# Patient Record
Sex: Female | Born: 1985 | Race: Black or African American | Hispanic: No | Smoking: Never smoker
Health system: Southern US, Community
[De-identification: ages and names within clinical notes are randomized; demographics above are authoritative.]

## PROBLEM LIST (undated history)

## (undated) ENCOUNTER — Emergency Department (HOSPITAL_COMMUNITY): Admission: EM | Payer: 59 | Source: Home / Self Care

## (undated) DIAGNOSIS — I10 Essential (primary) hypertension: Secondary | ICD-10-CM

## (undated) DIAGNOSIS — B9689 Other specified bacterial agents as the cause of diseases classified elsewhere: Secondary | ICD-10-CM

## (undated) DIAGNOSIS — N760 Acute vaginitis: Secondary | ICD-10-CM

## (undated) HISTORY — PX: NO PAST SURGERIES: SHX2092

---
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
## 2004-04-13 ENCOUNTER — Emergency Department (HOSPITAL_COMMUNITY): Admission: EM | Admit: 2004-04-13 | Discharge: 2004-04-13 | Payer: Self-pay | Admitting: Family Medicine

## 2005-06-14 ENCOUNTER — Emergency Department (HOSPITAL_COMMUNITY): Admission: EM | Admit: 2005-06-14 | Discharge: 2005-06-14 | Payer: Self-pay | Admitting: Family Medicine

## 2005-07-17 ENCOUNTER — Emergency Department (HOSPITAL_COMMUNITY): Admission: EM | Admit: 2005-07-17 | Discharge: 2005-07-17 | Payer: Self-pay | Admitting: Family Medicine

## 2005-07-30 ENCOUNTER — Emergency Department (HOSPITAL_COMMUNITY): Admission: EM | Admit: 2005-07-30 | Discharge: 2005-07-30 | Payer: Self-pay | Admitting: Emergency Medicine

## 2005-08-06 ENCOUNTER — Emergency Department (HOSPITAL_COMMUNITY): Admission: EM | Admit: 2005-08-06 | Discharge: 2005-08-06 | Payer: Self-pay | Admitting: Emergency Medicine

## 2005-10-11 ENCOUNTER — Observation Stay (HOSPITAL_COMMUNITY): Admission: AD | Admit: 2005-10-11 | Discharge: 2005-10-12 | Payer: Self-pay | Admitting: *Deleted

## 2005-10-12 ENCOUNTER — Ambulatory Visit: Payer: Self-pay | Admitting: Family Medicine

## 2005-11-16 ENCOUNTER — Emergency Department (HOSPITAL_COMMUNITY): Admission: EM | Admit: 2005-11-16 | Discharge: 2005-11-16 | Payer: Self-pay | Admitting: Family Medicine

## 2005-12-02 ENCOUNTER — Emergency Department (HOSPITAL_COMMUNITY): Admission: EM | Admit: 2005-12-02 | Discharge: 2005-12-02 | Payer: Self-pay | Admitting: Family Medicine

## 2005-12-03 ENCOUNTER — Emergency Department (HOSPITAL_COMMUNITY): Admission: EM | Admit: 2005-12-03 | Discharge: 2005-12-03 | Payer: Self-pay | Admitting: Emergency Medicine

## 2006-01-05 ENCOUNTER — Ambulatory Visit: Payer: Self-pay | Admitting: Internal Medicine

## 2006-02-21 ENCOUNTER — Ambulatory Visit: Payer: Self-pay | Admitting: Certified Nurse Midwife

## 2006-02-21 ENCOUNTER — Inpatient Hospital Stay (HOSPITAL_COMMUNITY): Admission: AD | Admit: 2006-02-21 | Discharge: 2006-02-24 | Payer: Self-pay | Admitting: Gynecology

## 2006-03-07 ENCOUNTER — Emergency Department (HOSPITAL_COMMUNITY): Admission: EM | Admit: 2006-03-07 | Discharge: 2006-03-07 | Payer: Self-pay | Admitting: Emergency Medicine

## 2007-06-11 ENCOUNTER — Emergency Department (HOSPITAL_COMMUNITY): Admission: EM | Admit: 2007-06-11 | Discharge: 2007-06-11 | Payer: Self-pay | Admitting: Family Medicine

## 2008-11-22 ENCOUNTER — Inpatient Hospital Stay (HOSPITAL_COMMUNITY): Admission: AD | Admit: 2008-11-22 | Discharge: 2008-11-22 | Payer: Self-pay | Admitting: Family Medicine

## 2009-02-06 ENCOUNTER — Ambulatory Visit: Payer: Self-pay | Admitting: Advanced Practice Midwife

## 2009-02-06 ENCOUNTER — Inpatient Hospital Stay (HOSPITAL_COMMUNITY): Admission: AD | Admit: 2009-02-06 | Discharge: 2009-02-06 | Payer: Self-pay | Admitting: Obstetrics & Gynecology

## 2009-02-15 ENCOUNTER — Emergency Department (HOSPITAL_COMMUNITY): Admission: EM | Admit: 2009-02-15 | Discharge: 2009-02-15 | Payer: Self-pay | Admitting: Family Medicine

## 2009-05-27 ENCOUNTER — Inpatient Hospital Stay (HOSPITAL_COMMUNITY): Admission: AD | Admit: 2009-05-27 | Discharge: 2009-05-29 | Payer: Self-pay | Admitting: Obstetrics and Gynecology

## 2009-10-04 ENCOUNTER — Emergency Department (HOSPITAL_COMMUNITY): Admission: EM | Admit: 2009-10-04 | Discharge: 2009-10-04 | Payer: Self-pay | Admitting: Family Medicine

## 2010-01-07 ENCOUNTER — Emergency Department (HOSPITAL_COMMUNITY): Admission: EM | Admit: 2010-01-07 | Discharge: 2010-01-07 | Payer: Self-pay | Admitting: Family Medicine

## 2010-03-16 ENCOUNTER — Emergency Department (HOSPITAL_COMMUNITY): Admission: EM | Admit: 2010-03-16 | Discharge: 2010-03-16 | Payer: Self-pay | Admitting: Emergency Medicine

## 2010-05-24 ENCOUNTER — Emergency Department (HOSPITAL_COMMUNITY): Admission: EM | Admit: 2010-05-24 | Discharge: 2010-05-24 | Payer: Self-pay | Admitting: Emergency Medicine

## 2010-10-21 LAB — URINALYSIS, ROUTINE W REFLEX MICROSCOPIC
Bilirubin Urine: NEGATIVE
Glucose, UA: NEGATIVE mg/dL
Ketones, ur: 15 mg/dL — AB
Leukocytes, UA: NEGATIVE
Nitrite: NEGATIVE
Protein, ur: 30 mg/dL — AB
Specific Gravity, Urine: 1.022 (ref 1.005–1.030)
Urobilinogen, UA: 0.2 mg/dL (ref 0.0–1.0)
pH: 5.5 (ref 5.0–8.0)

## 2010-10-21 LAB — RAPID URINE DRUG SCREEN, HOSP PERFORMED
Cocaine: NOT DETECTED
Opiates: NOT DETECTED
Tetrahydrocannabinol: POSITIVE — AB

## 2010-10-21 LAB — URINE MICROSCOPIC-ADD ON

## 2010-10-21 LAB — POCT I-STAT, CHEM 8
BUN: 11 mg/dL (ref 6–23)
Calcium, Ion: 1.13 mmol/L (ref 1.12–1.32)
Chloride: 107 mEq/L (ref 96–112)
Creatinine, Ser: 0.8 mg/dL (ref 0.4–1.2)
Glucose, Bld: 95 mg/dL (ref 70–99)

## 2010-10-27 ENCOUNTER — Inpatient Hospital Stay (INDEPENDENT_AMBULATORY_CARE_PROVIDER_SITE_OTHER)
Admission: RE | Admit: 2010-10-27 | Discharge: 2010-10-27 | Disposition: A | Discharge status: Home / Self Care | Payer: Self-pay | Source: Ambulatory Visit | Attending: Family Medicine | Admitting: Family Medicine

## 2010-10-27 DIAGNOSIS — L989 Disorder of the skin and subcutaneous tissue, unspecified: Secondary | ICD-10-CM

## 2010-10-27 DIAGNOSIS — R350 Frequency of micturition: Secondary | ICD-10-CM

## 2010-10-27 LAB — POCT URINALYSIS DIP (DEVICE)
Bilirubin Urine: NEGATIVE
Ketones, ur: NEGATIVE mg/dL
Specific Gravity, Urine: 1.025 (ref 1.005–1.030)
pH: 6 (ref 5.0–8.0)

## 2010-10-28 LAB — URINE CULTURE
Culture  Setup Time: 201203201325
Culture: NO GROWTH

## 2010-11-12 LAB — CBC
MCHC: 32.9 g/dL (ref 30.0–36.0)
MCHC: 33.6 g/dL (ref 30.0–36.0)
Platelets: 100 10*3/uL — ABNORMAL LOW (ref 150–400)
Platelets: 115 10*3/uL — ABNORMAL LOW (ref 150–400)
RBC: 3.59 MIL/uL — ABNORMAL LOW (ref 3.87–5.11)
RDW: 14.2 % (ref 11.5–15.5)
WBC: 8 10*3/uL (ref 4.0–10.5)

## 2010-11-15 LAB — URINALYSIS, ROUTINE W REFLEX MICROSCOPIC
Glucose, UA: NEGATIVE mg/dL
Leukocytes, UA: NEGATIVE
Nitrite: NEGATIVE
Protein, ur: NEGATIVE mg/dL
pH: 5.5 (ref 5.0–8.0)

## 2010-11-15 LAB — URINE MICROSCOPIC-ADD ON

## 2010-11-18 LAB — URINALYSIS, ROUTINE W REFLEX MICROSCOPIC
Bilirubin Urine: NEGATIVE
Hgb urine dipstick: NEGATIVE
Ketones, ur: NEGATIVE mg/dL
Nitrite: NEGATIVE
Specific Gravity, Urine: 1.02 (ref 1.005–1.030)
pH: 7.5 (ref 5.0–8.0)

## 2010-11-18 LAB — CBC
HCT: 36.7 % (ref 36.0–46.0)
Hemoglobin: 12.4 g/dL (ref 12.0–15.0)
MCHC: 33.8 g/dL (ref 30.0–36.0)
MCV: 89.7 fL (ref 78.0–100.0)
RBC: 4.09 MIL/uL (ref 3.87–5.11)
RDW: 13.9 % (ref 11.5–15.5)

## 2010-11-18 LAB — GC/CHLAMYDIA PROBE AMP, GENITAL
Chlamydia, DNA Probe: NEGATIVE
GC Probe Amp, Genital: NEGATIVE

## 2010-11-18 LAB — WET PREP, GENITAL: Yeast Wet Prep HPF POC: NONE SEEN

## 2010-12-25 NOTE — Discharge Summary (Signed)
Desiree Stewart, Desiree Stewart               ACCOUNT NO.:  [id number]   MEDICAL RECORD NO.:  [id number]          PATIENT TYPE:  INP   LOCATION:  9315                          FACILITY:  [facility]   PHYSICIAN:  Tracy L. Mayford Knife, M.D.DATE OF BIRTH:  1986-01-24   DATE OF ADMISSION:  10/11/2005  DATE OF DISCHARGE:  10/12/2005                                 DISCHARGE SUMMARY   ADMIT DIAGNOSES:  1.  Gastroenteritis.  2.  Dehydration.  3.  Second trimester pregnancy.  4.  No prenatal care.   DISCHARGE DIAGNOSES:  1.  Gastroenteritis.  2.  Dehydration.  3.  Second trimester pregnancy.  4.  No prenatal care.   LABORATORY:  Initial CBC showed a WBC of 7.9.  Repeat the following day  showed a WBC of 4.6.  Hemoglobin initially was 13.3.  After hydration it was  10.8.  Blood type A positive, antibody negative and RPR nonreactive.   IMAGING:  Ultrasound showed a single female infant in the transverse  presentation with an anterior placenta, no grade 1 and no previa.  AFI was  6.7 cm pocket, which is normal.  Fetal biometry consistent with a 19 week 1  day gestation, which gives an [facility] of March 07, 2006.  Cervix 3.8 cm.   HOSPITAL COURSE:  The patient is a 25 year old G1 at 91 weeks 3 days dated  by last menstrual period of May 31, 2005 consistent with a second  trimester ultrasound, who presented with nausea, vomiting and diarrhea.  She  complained of feeling weak and dizzy, and she had a friend, who was recently  admitted for the same.  Her initial vital signs showed that she was  tachycardic.  Her UA was consistent with her being dehydrated with a  specific gravity of 1.030 and consistent with her not eating as shown by the  ketones of greater than 80.  She was admitted for gastroenteritis and  dehydration.  The patient responded well to IV fluids, Zofran and Phenergan,  and on hospital day 2, she was stable for discharge.   DISPOSITION:  Home.   FOLLOW UP:  [facility]  on Thursday as previously  scheduled.   DISCHARGE INSTRUCTIONS:  The patient count told to do the BRAT diet and to  increase her fluid intake.  She is to return if she has more trouble.   ACTIVITY:  As tolerated.   MEDICATIONS:  1.  Prenatal vitamins.  2.  Phenergan 25 mg one p.o. q.6h. p.r.n. nausea.           ______________________________  Marc Morgans. Mayford Knife, M.D.     TLW/MEDQ  D:  10/12/2005  T:  10/12/2005  Job:  [id number]

## 2011-01-16 ENCOUNTER — Inpatient Hospital Stay (INDEPENDENT_AMBULATORY_CARE_PROVIDER_SITE_OTHER)
Admission: RE | Admit: 2011-01-16 | Discharge: 2011-01-16 | Disposition: A | Discharge status: Home / Self Care | Payer: Self-pay | Source: Ambulatory Visit | Attending: Emergency Medicine | Admitting: Emergency Medicine

## 2011-01-16 DIAGNOSIS — N898 Other specified noninflammatory disorders of vagina: Secondary | ICD-10-CM

## 2011-01-16 DIAGNOSIS — K089 Disorder of teeth and supporting structures, unspecified: Secondary | ICD-10-CM

## 2011-01-16 DIAGNOSIS — N760 Acute vaginitis: Secondary | ICD-10-CM

## 2011-01-16 LAB — POCT URINALYSIS DIP (DEVICE)
Glucose, UA: 100 mg/dL — AB
Ketones, ur: NEGATIVE mg/dL
Leukocytes, UA: NEGATIVE
Nitrite: NEGATIVE
pH: 5.5 (ref 5.0–8.0)

## 2011-01-16 LAB — WET PREP, GENITAL
Trich, Wet Prep: NONE SEEN
Yeast Wet Prep HPF POC: NONE SEEN

## 2011-01-16 LAB — POCT PREGNANCY, URINE: Preg Test, Ur: NEGATIVE

## 2011-01-19 LAB — GC/CHLAMYDIA PROBE AMP, GENITAL: GC Probe Amp, Genital: NEGATIVE

## 2011-03-23 ENCOUNTER — Inpatient Hospital Stay (INDEPENDENT_AMBULATORY_CARE_PROVIDER_SITE_OTHER)
Admission: RE | Admit: 2011-03-23 | Discharge: 2011-03-23 | Disposition: A | Discharge status: Home / Self Care | Payer: Self-pay | Source: Ambulatory Visit | Attending: Family Medicine | Admitting: Family Medicine

## 2011-03-23 DIAGNOSIS — N946 Dysmenorrhea, unspecified: Secondary | ICD-10-CM

## 2011-03-23 LAB — POCT URINALYSIS DIP (DEVICE)
Bilirubin Urine: NEGATIVE
Ketones, ur: NEGATIVE mg/dL
Leukocytes, UA: NEGATIVE
Protein, ur: NEGATIVE mg/dL
Specific Gravity, Urine: 1.02 (ref 1.005–1.030)
pH: 7.5 (ref 5.0–8.0)

## 2011-03-24 LAB — GC/CHLAMYDIA PROBE AMP, GENITAL: Chlamydia, DNA Probe: NEGATIVE

## 2011-05-18 LAB — I-STAT 8, (EC8 V) (CONVERTED LAB)
Chloride: 109
HCT: 46
Hemoglobin: 15.6 — ABNORMAL HIGH
Operator id: 235561
Potassium: 4.1

## 2011-07-30 ENCOUNTER — Encounter: Payer: Self-pay | Admitting: *Deleted

## 2011-07-30 ENCOUNTER — Emergency Department (INDEPENDENT_AMBULATORY_CARE_PROVIDER_SITE_OTHER)
Admission: EM | Admit: 2011-07-30 | Discharge: 2011-07-30 | Disposition: A | Discharge status: Home / Self Care | Payer: Self-pay | Source: Home / Self Care | Attending: Family Medicine | Admitting: Family Medicine

## 2011-07-30 DIAGNOSIS — N760 Acute vaginitis: Secondary | ICD-10-CM

## 2011-07-30 HISTORY — DX: Acute vaginitis: N76.0

## 2011-07-30 HISTORY — DX: Other specified bacterial agents as the cause of diseases classified elsewhere: B96.89

## 2011-07-30 LAB — POCT URINALYSIS DIP (DEVICE)
Bilirubin Urine: NEGATIVE
Glucose, UA: NEGATIVE mg/dL
Leukocytes, UA: NEGATIVE
Nitrite: NEGATIVE
Urobilinogen, UA: 0.2 mg/dL (ref 0.0–1.0)
pH: 5.5 (ref 5.0–8.0)

## 2011-07-30 MED ORDER — CLINDAMYCIN HCL 150 MG PO CAPS: 150.0000 mg | ORAL_CAPSULE | Freq: Four times a day (QID) | ORAL | Status: AC

## 2011-07-30 NOTE — ED Notes (Signed)
Pt states she was seen a few weeks ago for BV and was given Flagyl.  STates she took 1 pill, vomited and did not take any more.  States she thought it would go away on it's own.  Still having a discharge and a fishy odor.  On period now.

## 2011-07-30 NOTE — ED Provider Notes (Signed)
History     CSN: [id number]  Arrival date & time 07/30/11  9147   First MD Initiated Contact with Patient 07/30/11 1040      Chief Complaint  Patient presents with  . Vaginal Discharge    (Consider location/radiation/quality/duration/timing/severity/associated sxs/prior treatment) HPI Comments: The patient was seem here a few wks ago and dxed with bacterial vaginosis. Was given flagyl. Took one pill and it made her nauseated. She did not take anymore and did not contact us. States she hoped it would go away. No new symptoms. Currently on her period. No uti symptoms  The history is provided by the patient.    Past Medical History  Diagnosis Date  . Bacterial vaginosis     History reviewed. No pertinent past surgical history.  History reviewed. No pertinent family history.  History  Substance Use Topics  . Smoking status: Never Smoker   . Smokeless tobacco: Not on file  . Alcohol Use: Yes     socially    OB History    Grav Para Term Preterm Abortions TAB SAB Ect Mult Living                  Review of Systems  Constitutional: Negative.   HENT: Negative.   Cardiovascular: Negative.   Gastrointestinal: Negative.   Musculoskeletal: Negative.   Skin: Negative.   do not see need for repeat pelvic. Will treat with clinda Allergies  Review of patient's allergies indicates no known allergies.  Home Medications   Current Outpatient Rx  Name Route Sig Dispense Refill  . CLINDAMYCIN HCL 150 MG PO CAPS Oral Take 1 capsule (150 mg total) by mouth 4 (four) times daily. 28 capsule 0    BP 136/81  Pulse 77  Temp(Src) 98.2 F (36.8 C) (Oral)  Resp 16  SpO2 100%  LMP 07/26/2011  Physical Exam  Vitals reviewed. Cardiovascular: Normal rate.   Pulmonary/Chest: Effort normal.  Abdominal: Soft. There is no tenderness.    ED Course  Procedures (including critical care time)  Labs Reviewed  POCT URINALYSIS DIP (DEVICE) - Abnormal; Notable for the following:    Ketones, ur TRACE (*)    Hgb urine dipstick SMALL (*)    All other components within normal limits  POCT PREGNANCY, URINE  POCT URINALYSIS DIPSTICK  POCT PREGNANCY, URINE   No results found.   1. Vaginitis       MDM          Randa Spike, MD 07/30/11 1114

## 2012-10-22 ENCOUNTER — Emergency Department (HOSPITAL_COMMUNITY)
Admission: EM | Admit: 2012-10-22 | Discharge: 2012-10-22 | Disposition: A | Discharge status: Home / Self Care | Payer: BC Managed Care – PPO | Attending: Emergency Medicine | Admitting: Emergency Medicine

## 2012-10-22 ENCOUNTER — Emergency Department (HOSPITAL_COMMUNITY): Payer: BC Managed Care – PPO

## 2012-10-22 ENCOUNTER — Emergency Department (INDEPENDENT_AMBULATORY_CARE_PROVIDER_SITE_OTHER)
Admission: EM | Admit: 2012-10-22 | Discharge: 2012-10-22 | Disposition: A | Discharge status: Nursing Home (Includes ICF) | Payer: BC Managed Care – PPO | Source: Home / Self Care | Attending: Emergency Medicine | Admitting: Emergency Medicine

## 2012-10-22 ENCOUNTER — Encounter (HOSPITAL_COMMUNITY): Payer: Self-pay | Admitting: Physical Medicine and Rehabilitation

## 2012-10-22 ENCOUNTER — Encounter (HOSPITAL_COMMUNITY): Payer: Self-pay | Admitting: *Deleted

## 2012-10-22 DIAGNOSIS — K529 Noninfective gastroenteritis and colitis, unspecified: Secondary | ICD-10-CM

## 2012-10-22 DIAGNOSIS — R1011 Right upper quadrant pain: Secondary | ICD-10-CM | POA: Insufficient documentation

## 2012-10-22 DIAGNOSIS — G8929 Other chronic pain: Secondary | ICD-10-CM

## 2012-10-22 DIAGNOSIS — R112 Nausea with vomiting, unspecified: Secondary | ICD-10-CM | POA: Insufficient documentation

## 2012-10-22 DIAGNOSIS — Z8742 Personal history of other diseases of the female genital tract: Secondary | ICD-10-CM | POA: Insufficient documentation

## 2012-10-22 DIAGNOSIS — K5289 Other specified noninfective gastroenteritis and colitis: Secondary | ICD-10-CM | POA: Insufficient documentation

## 2012-10-22 DIAGNOSIS — R109 Unspecified abdominal pain: Secondary | ICD-10-CM

## 2012-10-22 DIAGNOSIS — R630 Anorexia: Secondary | ICD-10-CM | POA: Insufficient documentation

## 2012-10-22 LAB — COMPREHENSIVE METABOLIC PANEL
ALT: 15 U/L (ref 0–35)
AST: 23 U/L (ref 0–37)
Albumin: 4.2 g/dL (ref 3.5–5.2)
Alkaline Phosphatase: 46 U/L (ref 39–117)
Calcium: 9.6 mg/dL (ref 8.4–10.5)
Potassium: 3.7 mEq/L (ref 3.5–5.1)
Sodium: 136 mEq/L (ref 135–145)
Total Protein: 8.3 g/dL (ref 6.0–8.3)

## 2012-10-22 LAB — CBC WITH DIFFERENTIAL/PLATELET
Basophils Absolute: 0 10*3/uL (ref 0.0–0.1)
Basophils Relative: 1 % (ref 0–1)
Eosinophils Absolute: 0.1 10*3/uL (ref 0.0–0.7)
Eosinophils Relative: 1 % (ref 0–5)
Lymphs Abs: 2.4 10*3/uL (ref 0.7–4.0)
MCH: 29.6 pg (ref 26.0–34.0)
Neutrophils Relative %: 56 % (ref 43–77)
Platelets: 193 10*3/uL (ref 150–400)
RBC: 4.87 MIL/uL (ref 3.87–5.11)
RDW: 12.7 % (ref 11.5–15.5)

## 2012-10-22 LAB — POCT URINALYSIS DIP (DEVICE)
Bilirubin Urine: NEGATIVE
Glucose, UA: NEGATIVE mg/dL
Leukocytes, UA: NEGATIVE
Nitrite: NEGATIVE
Urobilinogen, UA: 0.2 mg/dL (ref 0.0–1.0)
pH: 6 (ref 5.0–8.0)

## 2012-10-22 MED ORDER — HYDROMORPHONE HCL PF 1 MG/ML IJ SOLN: 1.0000 mg | Freq: Once | INTRAMUSCULAR | Status: DC

## 2012-10-22 MED ORDER — ONDANSETRON HCL 4 MG PO TABS
4.0000 mg | ORAL_TABLET | Freq: Four times a day (QID) | ORAL | Status: DC
Start: 2012-10-22 — End: 2014-02-11

## 2012-10-22 MED ORDER — TRAMADOL HCL 50 MG PO TABS: 50.0000 mg | ORAL_TABLET | Freq: Four times a day (QID) | ORAL | Status: DC | PRN

## 2012-10-22 MED ORDER — ONDANSETRON 4 MG PO TBDP
8.0000 mg | ORAL_TABLET | Freq: Once | ORAL | Status: AC
  Administered 2012-10-22: 8 mg via ORAL

## 2012-10-22 MED ORDER — ONDANSETRON 4 MG PO TBDP
ORAL_TABLET | ORAL | Status: AC
  Filled 2012-10-22: qty 2

## 2012-10-22 NOTE — ED Provider Notes (Signed)
History     CSN: [id number]  Arrival date & time 10/22/12  1411   First MD Initiated Contact with Patient 10/22/12 1613      Chief Complaint  Patient presents with  . Nausea    (Consider location/radiation/quality/duration/timing/severity/associated sxs/prior treatment) Patient is a 27 y.o. female presenting with abdominal pain. The history is provided by the patient.  Abdominal Pain Pain location:  RUQ and epigastric Pain quality: bloating, burning, fullness and sharp   Pain radiates to:  Does not radiate Pain severity:  Severe Onset quality:  Gradual Duration:  2 days Timing:  Constant Progression:  Worsening Chronicity:  New Context: eating   Relieved by:  Nothing Worsened by:  Eating Ineffective treatments:  Antacids Associated symptoms: nausea and vomiting   Associated symptoms: no chills, no constipation, no diarrhea and no fever   Pt reports onset of nausea and abd pain that started Friday. Pt states she has been unable to eat because eating makes her symptoms worse. She feels very bloated after eating. She tried antacids which did not help. She had one episode of vomiting Friday after onset of symptoms but none since. Denies diarrhea or fever. Denies other associated symptoms. LMP (just finished a normal menses 2 days ago). Denies UTI sx's. Past Medical History  Diagnosis Date  . Bacterial vaginosis     History reviewed. No pertinent past surgical history.  No family history on file.  History  Substance Use Topics  . Smoking status: Never Smoker   . Smokeless tobacco: Not on file  . Alcohol Use: Yes     Comment: socially    OB History   Grav Para Term Preterm Abortions TAB SAB Ect Mult Living                  Review of Systems  Constitutional: Positive for appetite change. Negative for fever and chills.  HENT: Negative.   Eyes: Negative.   Respiratory: Negative.   Cardiovascular: Negative.   Gastrointestinal: Positive for nausea, vomiting and  abdominal pain. Negative for diarrhea, constipation and abdominal distention.  All other systems reviewed and are negative.    Allergies  Review of patient's allergies indicates no known allergies.  Home Medications  No current outpatient prescriptions on file.  BP 155/97  Pulse 93  Temp(Src) 98.7 F (37.1 C) (Oral)  Resp 19  SpO2 100%  LMP 10/20/2012  Physical Exam  Constitutional: She is oriented to person, place, and time. She appears well-developed and well-nourished.  HENT:  Head: Normocephalic and atraumatic.  Eyes: Conjunctivae are normal.  Neck: Neck supple.  Cardiovascular: Normal rate and regular rhythm.   Pulmonary/Chest: Effort normal and breath sounds normal.  Abdominal: Soft. Bowel sounds are normal. She exhibits no distension. There is tenderness in the right upper quadrant. There is guarding.  Musculoskeletal: Normal range of motion.  Neurological: She is alert and oriented to person, place, and time.  Skin: Skin is warm and dry.  Psychiatric: She has a normal mood and affect.    ED Course  Procedures (including critical care time)  Labs Reviewed  POCT URINALYSIS DIP (DEVICE) - Abnormal; Notable for the following:    Ketones, ur 15 (*)    Hgb urine dipstick MODERATE (*)    Protein, ur 30 (*)    All other components within normal limits  POCT PREGNANCY, URINE   No results found.   1. Abdominal pain, chronic, right upper quadrant   2. Nausea & vomiting  MDM  3 day h/o persistent nausea (one episode of vomiting) abd pain. Denies diarrhea or fever. Pt w/ TTP over the RUQ and epigastrium on exam concerning for gallstones. Pt medicated for pain and nausea and transported via shuttle to [facility] for further eval and likely abd u/s to r/o gallstones. Pt agreeable w/ plan.        Leanne Chang, NP 10/22/12 1742

## 2012-10-22 NOTE — ED Notes (Signed)
Pt presents to department from [facility] for evaluation of intermittent RUQ and epigastric pain. Onset Friday. Also states nausea. Denies urinary symptoms. No fever. Pt is conscious alert and oriented x4.

## 2012-10-22 NOTE — ED Provider Notes (Signed)
Medical screening examination/treatment/procedure(s) were performed by non-physician practitioner and as supervising physician I was immediately available for consultation/collaboration.  Aws Shere, M.D.  Gael Delude C Havilah Topor, MD 10/22/12 2042 

## 2012-10-22 NOTE — ED Notes (Signed)
Pt was transported to [place] prior to this RN assessment of pt.

## 2012-10-22 NOTE — ED Provider Notes (Signed)
History     CSN: [id number]  Arrival date & time 10/22/12  1805   First MD Initiated Contact with Patient 10/22/12 1810      Chief Complaint  Patient presents with  . Abdominal Pain    (Consider location/radiation/quality/duration/timing/severity/associated sxs/prior treatment) HPI Comments: 27 year old female with no significant past medical history presents to the ED from urgent care with right upper quadrant abdominal pain x3 days. She was evaluated at urgent care prior to arrival at the ED, and was sent over here for evaluation of her gallbladder. Abdominal pain has been intermittent, worse after eating with episode lasting about 45 minutes. She describes the pain as sharp, radiating to her back rated 8/10 at its worst. Currently she is not having any pain. Admits to associated nausea and one episode of vomiting when she made herself throw up 2 days ago to try to feel better. She has not had much of an appetite over the past couple days, however she was able to eat toast and crackers with [place] bacon earlier today. Her normal diet consists of a lot of fast food, fatty and fried food. Denies personal or family history of gallbladder disease. Denies fever, chills or diarrhea. Last menstrual period ended 2 days ago and was normal. Denies any urinary or vaginal complaints.  Patient is a 27 y.o. female presenting with abdominal pain. The history is provided by the patient.  Abdominal Pain Associated symptoms: nausea and vomiting   Associated symptoms: no chills, no diarrhea and no fever     Past Medical History  Diagnosis Date  . Bacterial vaginosis     No past surgical history on file.  No family history on file.  History  Substance Use Topics  . Smoking status: Never Smoker   . Smokeless tobacco: Not on file  . Alcohol Use: Yes     Comment: socially    OB History   Grav Para Term Preterm Abortions TAB SAB Ect Mult Living                  Review of Systems   Constitutional: Positive for appetite change. Negative for fever and chills.  Gastrointestinal: Positive for nausea, vomiting and abdominal pain. Negative for diarrhea.  All other systems reviewed and are negative.    Allergies  Review of patient's allergies indicates no known allergies.  Home Medications  No current outpatient prescriptions on file.  LMP 10/20/2012  Physical Exam  Nursing note and vitals reviewed. Constitutional: She is oriented to person, place, and time. She appears well-developed and well-nourished. No distress.  HENT:  Head: Normocephalic and atraumatic.  Mouth/Throat: Oropharynx is clear and moist.  Eyes: Conjunctivae and EOM are normal.  Neck: Normal range of motion. Neck supple.  Cardiovascular: Normal rate, regular rhythm and normal heart sounds.   Pulmonary/Chest: Effort normal and breath sounds normal. No respiratory distress. She has no wheezes.  Abdominal: Soft. Normal appearance and bowel sounds are normal. She exhibits no distension and no mass. There is tenderness in the right upper quadrant. There is guarding. There is no rigidity, no rebound, no CVA tenderness, no tenderness at McBurney's point and negative Murphy's sign.  Musculoskeletal: Normal range of motion. She exhibits no edema.  Neurological: She is alert and oriented to person, place, and time.  Skin: Skin is warm and dry. She is not diaphoretic.  Psychiatric: She has a normal mood and affect. Her behavior is normal.    ED Course  Procedures (including critical care time)  Labs Reviewed  CBC WITH DIFFERENTIAL  COMPREHENSIVE METABOLIC PANEL  LIPASE, BLOOD   US Abdomen Complete  10/22/2012  *RADIOLOGY REPORT*  Clinical Data:  27 year old female with abdominal pain.  ABDOMINAL ULTRASOUND COMPLETE  Comparison:  None  Findings:  Gallbladder:  The gallbladder is unremarkable. There is no evidence of gallstones, gallbladder wall thickening, or pericholecystic fluid.  Common Bile Duct:   There is no evidence of intrahepatic or extrahepatic biliary dilation. The CBD measures 4.1 mm in greatest diameter.  Liver:  The liver is within normal limits in parenchymal echogenicity. No focal abnormalities are identified.  IVC:  Appears normal.  Pancreas:  Although the pancreas is difficult to visualize in its entirety, no focal pancreatic abnormality is identified.  Spleen:  Within normal limits in size and echotexture.  Right kidney:  The right kidney is normal in size and parenchymal echogenicity.  There is no evidence of solid mass, hydronephrosis or definite renal calculi.  The right kidney measures 10.6 cm.  Left kidney:  The left kidney is normal in size and parenchymal echogenicity.  There is no evidence of solid mass, hydronephrosis or definite renal calculi.   The left kidney measures 11.3 cm.  Abdominal Aorta:  No abdominal aortic aneurysm identified.  There is no evidence of ascites.  IMPRESSION: Negative abdominal ultrasound.   Original Report Authenticated By: Harmon Pier, M.D.      1. Abdominal pain   2. Gastroenteritis       MDM  27 year old female sent over to the ED from urgent care to evaluate her gallbladder. Abdominal ultrasound unremarkable. Labs unremarkable. Pain and nausea has not been present in the emergency department. Diagnosis most likely viral gastroenteritis. After evaluating patient, she states she had sushi two nights ago. I advised her to eat a bland diet for the next few days. Tramadol and Zofran given for comfort at home. Close return precautions discussed. Patient states understanding of plan and is agreeable.        Trevor Mace, PA-C 10/22/12 1941

## 2012-10-22 NOTE — ED Provider Notes (Signed)
Medical screening examination/treatment/procedure(s) were conducted as a shared visit with non-physician practitioner(s) and myself.  I personally evaluated the patient during the encounter.  3 days of intermittent episodes of right upper quadrant and epigastric abdominal pain lasting a few hours at a time after she eats, she forced herself to vomit once 2 days ago but that did not change her pain, she said no vomiting otherwise no dysuria, vaginal bleeding, vaginal discharge, chest pain, shortness of breath, she did receive Zofran prior to arrival at the urgent care for some nausea, she does not want pain medicines now, her pain is now mild, she just had a negative pregnancy test and normal urinalysis the urgent care center to the ED for ultrasound of her gallbladder, her examination shows minimal tenderness to the epigastrium and right upper quadrant now with no peritonitis.  Hurman Horn, MD 10/23/12 [phone]

## 2012-10-22 NOTE — ED Notes (Signed)
Patient complains of nausea, lower back pain, and abdominal pain x 3 days.

## 2013-12-29 ENCOUNTER — Encounter (HOSPITAL_COMMUNITY): Payer: Self-pay | Admitting: Emergency Medicine

## 2013-12-29 ENCOUNTER — Emergency Department (HOSPITAL_COMMUNITY)
Admission: EM | Admit: 2013-12-29 | Discharge: 2013-12-29 | Disposition: A | Discharge status: Home / Self Care | Payer: BC Managed Care – PPO | Attending: Emergency Medicine | Admitting: Emergency Medicine

## 2013-12-29 DIAGNOSIS — K029 Dental caries, unspecified: Secondary | ICD-10-CM | POA: Insufficient documentation

## 2013-12-29 DIAGNOSIS — K0889 Other specified disorders of teeth and supporting structures: Secondary | ICD-10-CM

## 2013-12-29 DIAGNOSIS — K089 Disorder of teeth and supporting structures, unspecified: Secondary | ICD-10-CM | POA: Insufficient documentation

## 2013-12-29 DIAGNOSIS — Z8619 Personal history of other infectious and parasitic diseases: Secondary | ICD-10-CM | POA: Insufficient documentation

## 2013-12-29 DIAGNOSIS — Z8742 Personal history of other diseases of the female genital tract: Secondary | ICD-10-CM | POA: Insufficient documentation

## 2013-12-29 MED ORDER — HYDROCODONE-ACETAMINOPHEN 5-325 MG PO TABS: 2.0000 | ORAL_TABLET | ORAL | Status: DC | PRN

## 2013-12-29 MED ORDER — PENICILLIN V POTASSIUM 500 MG PO TABS: 500.0000 mg | ORAL_TABLET | Freq: Four times a day (QID) | ORAL | Status: DC

## 2013-12-29 NOTE — ED Provider Notes (Signed)
CSN: [id number]     Arrival date & time 12/29/13  2254 History   First MD Initiated Contact with Patient 12/29/13 2306     Chief Complaint  Patient presents with  . Dental Pain     (Consider location/radiation/quality/duration/timing/severity/associated sxs/prior Treatment) HPI Comments: Pt has hx of dental pain in the upper and lower teeth on the L upper and lower jaw Onset years ago Intermittent pain over time 1 week ago became worse but today was much worse when she ate a sandwhich Has associated temperature sensitivity Worse with chewing, she has not had dental work.  The history is provided by the patient.    Past Medical History  Diagnosis Date  . Bacterial vaginosis    History reviewed. No pertinent past surgical history. History reviewed. No pertinent family history. History  Substance Use Topics  . Smoking status: Never Smoker   . Smokeless tobacco: Not on file  . Alcohol Use: Yes     Comment: socially   OB History   Grav Para Term Preterm Abortions TAB SAB Ect Mult Living                 Review of Systems  Constitutional: Negative for fever.  HENT: Positive for dental problem.       Allergies  Nsaids  Home Medications   Prior to Admission medications   Medication Sig Start Date End Date Taking? Authorizing Provider  acetaminophen (TYLENOL) 500 MG tablet Take 1,000 mg by mouth every 6 (six) hours as needed for pain. For pain    Historical Provider, MD  bismuth subsalicylate (PEPTO BISMOL) 262 MG chewable tablet Chew 524 mg by mouth as needed for indigestion. For indigestion    Historical Provider, MD  HYDROcodone-acetaminophen (NORCO/VICODIN) 5-325 MG per tablet Take 2 tablets by mouth every 4 (four) hours as needed. 12/29/13   Vida RollerBrian D Evelin Cake, MD  ondansetron (ZOFRAN) 4 MG tablet Take 1 tablet (4 mg total) by mouth every 6 (six) hours. 10/22/12   Trevor Maceobyn M Albert, PA-C  penicillin v potassium (VEETID) 500 MG tablet Take 1 tablet (500 mg total) by mouth 4  (four) times daily. 12/29/13   Vida RollerBrian D Jermarion Poffenberger, MD  traMADol (ULTRAM) 50 MG tablet Take 1 tablet (50 mg total) by mouth every 6 (six) hours as needed for pain. 10/22/12   Trevor Maceobyn M Albert, PA-C   BP 139/78  Pulse 97  Temp(Src) 98.5 F (36.9 C)  Resp 18  SpO2 100%  LMP 12/26/2013 Physical Exam  Nursing note and vitals reviewed. Constitutional: She appears well-developed and well-nourished. No distress.  HENT:  Head: Normocephalic and atraumatic.  Mouth/Throat: Oropharynx is clear and moist. No oropharyngeal exudate.  Dental Disease - deep caries in upper and lower left sided molars (total of 3)  Eyes: Conjunctivae are normal. No scleral icterus.  Neck: Normal range of motion. Neck supple. No thyromegaly present.  Cardiovascular: Normal rate and regular rhythm.   Pulmonary/Chest: Effort normal and breath sounds normal.  Lymphadenopathy:    She has no cervical adenopathy.  Neurological: She is alert.  Skin: Skin is warm and dry. No rash noted. She is not diaphoretic.    ED Course  Procedures (including critical care time) Labs Review Labs Reviewed - No data to display  Imaging Review No results found.    MDM   Final diagnoses:  Dental caries  Pain, dental    Pt has dental disease, no signs of jaw infection, no torticollis, no trismus, no signs of ludwigs, stable  for d/c.  She is driving, Rx given.  Meds given in ED:  Medications - No data to display  New Prescriptions   HYDROCODONE-ACETAMINOPHEN (NORCO/VICODIN) 5-325 MG PER TABLET    Take 2 tablets by mouth every 4 (four) hours as needed.   PENICILLIN V POTASSIUM (VEETID) 500 MG TABLET    Take 1 tablet (500 mg total) by mouth 4 (four) times daily.        Vida Roller, MD 12/29/13 [phone]

## 2013-12-29 NOTE — ED Notes (Signed)
Patient is alert and oriented x3.  She was given DC instructions and follow up visit instructions.  Patient gave verbal understanding. She was DC ambulatory under her own power to home.  V/S stable.  He was not showing any signs of distress on DC 

## 2013-12-29 NOTE — Discharge Instructions (Signed)
Please call your doctor for a followup appointment within 24-48 hours. When you talk to your doctor please let them know that you were seen in the emergency department and have them acquire all of your records so that they can discuss the findings with you and formulate a treatment plan to fully care for your new and ongoing problems. ° °

## 2013-12-29 NOTE — ED Notes (Signed)
Patient is alert and oriented x3.  She is complaining of left sided dental pain on the upper and lower Bridge of teeth.  She states that she has three cavities with exposed nerves and they have became  More painful over the last week.  Currently she rates her pain 9 of 10.

## 2014-02-11 ENCOUNTER — Encounter (HOSPITAL_COMMUNITY): Payer: Self-pay | Admitting: Emergency Medicine

## 2014-02-11 ENCOUNTER — Emergency Department (HOSPITAL_COMMUNITY)
Admission: EM | Admit: 2014-02-11 | Discharge: 2014-02-11 | Disposition: A | Discharge status: Home / Self Care | Payer: BC Managed Care – PPO | Attending: Emergency Medicine | Admitting: Emergency Medicine

## 2014-02-11 DIAGNOSIS — Z79899 Other long term (current) drug therapy: Secondary | ICD-10-CM | POA: Insufficient documentation

## 2014-02-11 DIAGNOSIS — M4120 Other idiopathic scoliosis, site unspecified: Secondary | ICD-10-CM | POA: Insufficient documentation

## 2014-02-11 DIAGNOSIS — R202 Paresthesia of skin: Secondary | ICD-10-CM

## 2014-02-11 DIAGNOSIS — R209 Unspecified disturbances of skin sensation: Secondary | ICD-10-CM | POA: Insufficient documentation

## 2014-02-11 DIAGNOSIS — Z87440 Personal history of urinary (tract) infections: Secondary | ICD-10-CM | POA: Insufficient documentation

## 2014-02-11 DIAGNOSIS — M25539 Pain in unspecified wrist: Secondary | ICD-10-CM | POA: Insufficient documentation

## 2014-02-11 DIAGNOSIS — IMO0001 Reserved for inherently not codable concepts without codable children: Secondary | ICD-10-CM | POA: Insufficient documentation

## 2014-02-11 DIAGNOSIS — R42 Dizziness and giddiness: Secondary | ICD-10-CM

## 2014-02-11 DIAGNOSIS — M79632 Pain in left forearm: Secondary | ICD-10-CM

## 2014-02-11 LAB — CBC WITH DIFFERENTIAL/PLATELET
BASOS ABS: 0 10*3/uL (ref 0.0–0.1)
Basophils Relative: 1 % (ref 0–1)
EOS PCT: 3 % (ref 0–5)
Eosinophils Absolute: 0.1 10*3/uL (ref 0.0–0.7)
HCT: 38.8 % (ref 36.0–46.0)
Hemoglobin: 13 g/dL (ref 12.0–15.0)
LYMPHS PCT: 37 % (ref 12–46)
Lymphs Abs: 1.3 10*3/uL (ref 0.7–4.0)
MCH: 28.8 pg (ref 26.0–34.0)
MCHC: 33.5 g/dL (ref 30.0–36.0)
MCV: 86 fL (ref 78.0–100.0)
Monocytes Absolute: 0.3 10*3/uL (ref 0.1–1.0)
Monocytes Relative: 9 % (ref 3–12)
NEUTROS ABS: 1.7 10*3/uL (ref 1.7–7.7)
NEUTROS PCT: 50 % (ref 43–77)
PLATELETS: 152 10*3/uL (ref 150–400)
RBC: 4.51 MIL/uL (ref 3.87–5.11)
RDW: 12.8 % (ref 11.5–15.5)
WBC: 3.5 10*3/uL — AB (ref 4.0–10.5)

## 2014-02-11 LAB — I-STAT CHEM 8, ED
BUN: 15 mg/dL (ref 6–23)
CHLORIDE: 103 meq/L (ref 96–112)
CREATININE: 0.9 mg/dL (ref 0.50–1.10)
Calcium, Ion: 1.16 mmol/L (ref 1.12–1.23)
GLUCOSE: 98 mg/dL (ref 70–99)
HEMATOCRIT: 40 % (ref 36.0–46.0)
Hemoglobin: 13.6 g/dL (ref 12.0–15.0)
POTASSIUM: 3.8 meq/L (ref 3.7–5.3)
Sodium: 139 mEq/L (ref 137–147)
TCO2: 26 mmol/L (ref 0–100)

## 2014-02-11 NOTE — ED Notes (Signed)
Pt arrived to the ED with a complaint of arm pain and numbness.  Pt states the pain began yesterday around 2200 hrs.  Pt states pain is hard to describe but says it is a numbness. Pt also is having pain in right big toe.  Pt has had this numbness/tingling feeling for three days

## 2014-02-11 NOTE — ED Provider Notes (Signed)
CSN: [id number]     Arrival date & time 02/11/14  0444 History   First MD Initiated Contact with Patient 02/11/14 0448     Chief Complaint  Patient presents with  . Arm Pain     (Consider location/radiation/quality/duration/timing/severity/associated sxs/prior Treatment) HPI Desiree Stewart is a 28 y.o. female who presents to emergency department complaining of pain in the left arm. Patient states that she woke up from sleep on her back, states she felt tightness in the left forearm. She states that is improving now. She also reports an episode of numbness sensation in the left great toe no was there a few days ago, now resolved. States also had 2 episodes of dizziness in the last 2 days which only lasted a few seconds at a time. She states all the symptoms are usual for her, and when she developed the left forearm numbness, she came to emergency department. I asked patient what she's concerned about it she stated blood clots. Patient denies any recent travel, or surgeries, and denies smoking. She's not on any oral contraceptives. She denies being pregnant, states she's not sexually active. Patient denies any injuries or recent strenuous activities. She patient states she does have scoliosis but denies any neck or back pain. She denies fever chills. No headache.  Past Medical History  Diagnosis Date  . Bacterial vaginosis    History reviewed. No pertinent past surgical history. History reviewed. No pertinent family history. History  Substance Use Topics  . Smoking status: Never Smoker   . Smokeless tobacco: Not on file  . Alcohol Use: Yes     Comment: socially   OB History   Grav Para Term Preterm Abortions TAB SAB Ect Mult Living                 Review of Systems  Constitutional: Negative for fever and chills.  Respiratory: Negative for cough, chest tightness and shortness of breath.   Cardiovascular: Negative for chest pain, palpitations and leg swelling.  Gastrointestinal: Negative  for nausea, vomiting, abdominal pain and diarrhea.  Genitourinary: Negative for dysuria, flank pain and pelvic pain.  Musculoskeletal: Positive for arthralgias and myalgias. Negative for neck pain and neck stiffness.  Skin: Negative for rash.  Neurological: Positive for dizziness, light-headedness and numbness. Negative for weakness and headaches.  All other systems reviewed and are negative.     Allergies  Nsaids  Home Medications   Prior to Admission medications   Medication Sig Start Date End Date Taking? Authorizing Provider  acetaminophen (TYLENOL) 500 MG tablet Take 1,000 mg by mouth every 6 (six) hours as needed for pain. For pain    Historical Provider, MD  bismuth subsalicylate (PEPTO BISMOL) 262 MG chewable tablet Chew 524 mg by mouth as needed for indigestion. For indigestion    Historical Provider, MD  HYDROcodone-acetaminophen (NORCO/VICODIN) 5-325 MG per tablet Take 2 tablets by mouth every 4 (four) hours as needed. 12/29/13   Vida RollerBrian D Miller, MD  ondansetron (ZOFRAN) 4 MG tablet Take 1 tablet (4 mg total) by mouth every 6 (six) hours. 10/22/12   Trevor Maceobyn M Albert, PA-C  penicillin v potassium (VEETID) 500 MG tablet Take 1 tablet (500 mg total) by mouth 4 (four) times daily. 12/29/13   Vida RollerBrian D Miller, MD  traMADol (ULTRAM) 50 MG tablet Take 1 tablet (50 mg total) by mouth every 6 (six) hours as needed for pain. 10/22/12   Trevor Maceobyn M Albert, PA-C   BP 159/89  Pulse 85  Temp(Src) 99 F (  37.2 C) (Oral)  Resp 16  Ht 5\' 5"  (1.651 m)  Wt 152 lb (68.947 kg)  BMI 25.29 kg/m2  SpO2 100%  LMP 02/05/2014 Physical Exam  Nursing note and vitals reviewed. Constitutional: She is oriented to person, place, and time. She appears well-developed and well-nourished. No distress.  HENT:  Head: Normocephalic.  Eyes: Conjunctivae and EOM are normal. Pupils are equal, round, and reactive to light.  Neck: Normal range of motion. Neck supple.  Cardiovascular: Normal rate, regular rhythm and normal  heart sounds.   Pulmonary/Chest: Effort normal and breath sounds normal. No respiratory distress. She has no wheezes. She has no rales.  Musculoskeletal: She exhibits no edema.  Normal left arm and forarm. Full rom of the arm at shoulder, elbow, wrist. No tenderness over the left forearm. Normal appearing with no swelling, discoloration. Distal radial pulses intact. Hand is normal. Full rom of all fingers. Pt is able to spread all fingers, do thumbs up, make a fist. Strength is intact against resistance.   Neurological: She is alert and oriented to person, place, and time. No cranial nerve deficit. Coordination normal.  Skin: Skin is warm and dry.  Psychiatric: She has a normal mood and affect. Her behavior is normal.    ED Course  Procedures (including critical care time) Labs Review Labs Reviewed  CBC WITH DIFFERENTIAL - Abnormal; Notable for the following:    WBC 3.5 (*)    All other components within normal limits  I-STAT CHEM 8, ED    Imaging Review No results found.   EKG Interpretation None      MDM   Final diagnoses:  Left forearm pain  Dizziness  Paresthesia of left foot    Pt with non specific complaints, currently symptom free. Labs unremarkable. She is stable for d/c home at this time. No concern for PE or DVT, pt is wells and perc negative. Pt reassured.    Filed Vitals:   02/11/14 0455 02/11/14 0614  BP: 159/89 133/84  Pulse: 85 72  Temp: 99 F (37.2 C) 98.2 F (36.8 C)  TempSrc: Oral Oral  Resp: 16 18  Height: 5\' 5"  (1.651 m)   Weight: 152 lb (68.947 kg)   SpO2: 100% 100%       Desiree Musselatyana A Gene Glazebrook, PA-C 02/11/14 0615

## 2014-02-11 NOTE — Discharge Instructions (Signed)
Make sure you stay hydrated. Follow up with your doctor in the office. Your blood work looks normal. Return if symptoms return and not improving.

## 2014-02-12 NOTE — ED Provider Notes (Signed)
Medical screening examination/treatment/procedure(s) were performed by non-physician practitioner and as supervising physician I was immediately available for consultation/collaboration.  Chon Buhl T Candiace West, MD 02/12/14 1007 

## 2015-10-19 ENCOUNTER — Encounter (HOSPITAL_COMMUNITY): Payer: Self-pay | Admitting: *Deleted

## 2015-10-19 ENCOUNTER — Emergency Department (INDEPENDENT_AMBULATORY_CARE_PROVIDER_SITE_OTHER)
Admission: EM | Admit: 2015-10-19 | Discharge: 2015-10-19 | Disposition: A | Discharge status: Home / Self Care | Payer: Self-pay | Source: Home / Self Care

## 2015-10-19 DIAGNOSIS — I10 Essential (primary) hypertension: Secondary | ICD-10-CM

## 2015-10-19 DIAGNOSIS — J069 Acute upper respiratory infection, unspecified: Secondary | ICD-10-CM

## 2015-10-19 DIAGNOSIS — G44209 Tension-type headache, unspecified, not intractable: Secondary | ICD-10-CM

## 2015-10-19 MED ORDER — FLUTICASONE PROPIONATE 50 MCG/ACT NA SUSP: 2.0000 | Freq: Every day | NASAL | Status: DC

## 2015-10-19 MED ORDER — AMLODIPINE BESYLATE 5 MG PO TABS: 5.0000 mg | ORAL_TABLET | Freq: Every day | ORAL | Status: DC

## 2015-10-19 NOTE — ED Provider Notes (Signed)
CSN: [id number]   [id number]  Arrival date & time 10/19/15  1300 History   None    Chief Complaint  Patient presents with  . Hypertension   (Consider location/radiation/quality/duration/timing/severity/associated sxs/prior Treatment) HPI Comments: Patient c/o having URI over one week ago which is resolving but she still has post nasal gtt and some stuffiness.  She has been having headaches over her left eye and they have migraine qualities and she is having elevated bp.  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  She has been having readings of 170/100.  She has family hx of HTN.  Her twin is hypertensive.  She is getting over URI.  Patient is a [age] y.o. female presenting with hypertension. The history is provided by the patient.  Hypertension This is a new problem. The current episode started more than 2 days ago. The problem occurs constantly. The problem has been gradually improving. Associated symptoms include headaches. Nothing aggravates the symptoms. Nothing relieves the symptoms. She has tried nothing for the symptoms.    Past Medical History  Diagnosis Date  . Bacterial vaginosis    History reviewed. No pertinent past surgical history. No family history on file. Social History  Substance Use Topics  . Smoking status: Never Smoker   . Smokeless tobacco: None  . Alcohol Use: No   OB History    No data available     Review of Systems  Constitutional: Negative.   HENT: Positive for postnasal drip.   Eyes: Negative.   Respiratory: Negative.   Cardiovascular:       Elevated BP readings  Gastrointestinal: Negative.   Endocrine: Negative.   Genitourinary: Negative.   Musculoskeletal: Negative.   Skin: Negative.   Allergic/Immunologic: Negative.   Neurological: Positive for headaches.  Hematological: Negative.   Psychiatric/Behavioral: Negative.     Allergies  Nsaids  Home Medications   Prior to Admission medications   Medication  Sig Start Date End Date Taking? Authorizing Provider  acetaminophen (TYLENOL) 500 MG tablet Take 1,000 mg by mouth every 6 (six) hours as needed for pain. For pain    Historical Provider, MD  HYDROcodone-acetaminophen (NORCO/VICODIN) 5-325 MG per tablet Take 2 tablets by mouth every 4 (four) hours as needed. 12/29/13   Eber HongBrian Miller, MD  ibuprofen (ADVIL,MOTRIN) 200 MG tablet Take 400 mg by mouth every 6 (six) hours as needed for moderate pain.    Historical Provider, MD  Multiple Vitamin (MULTIVITAMIN WITH MINERALS) TABS tablet Take 1 tablet by mouth daily.    Historical Provider, MD   Meds Ordered and Administered this Visit  Medications - No data to display  BP 172/95 mmHg  Pulse 76  Temp(Src) 99.4 F (37.4 C) (Oral)  SpO2 100%  LMP 10/18/2015 (Exact Date) No data found.   Physical Exam  Constitutional: She appears well-developed and well-nourished.  HENT:  Head: Normocephalic and atraumatic.  Right Ear: External ear normal.  Left Ear: External ear normal.  Mouth/Throat: Oropharynx is clear and moist.  Nares with decreased patency  Eyes: Conjunctivae and EOM are normal. Pupils are equal, round, and reactive to light.  Neck: Normal range of motion. Neck supple.  Cardiovascular: Normal rate, regular rhythm and normal heart sounds.   Pulmonary/Chest: Effort normal and breath sounds normal.  Abdominal: Soft. Bowel sounds are normal.  Neurological: She is alert.    ED Course  Procedures (including critical care time)  Labs Review Labs Reviewed - No data to display  Imaging Review No results found.   Visual Acuity Review  Right Eye Distance:   Left Eye Distance:   Bilateral Distance:    Right Eye Near:   Left Eye Near:    Bilateral Near:         MDM  Tension headache - Not currently bothering her.  Take motrin and tylenol otc as directed.  URI - this is resolving and rx for flonase NS 2 sprays per nostril qd #1  Hypertension - Her repeat bp is 160/100 and she  has been sitting resting for over 30 minutes. Tx with amlodipine  one po qd #30  Follow up with PCP.      Deatra Canter, FNP 10/19/15 1435

## 2015-10-19 NOTE — ED Notes (Signed)
C/O having had flu-like sxs (aches, chills, sore throat, cough) over past 1.5 wks, which have mostly resolved.  Continues with cough and runny nose.  C/O HA with HTN x 2 days.  BP 2 days ago was 170/100 & 155/90 (checked at work).

## 2015-10-19 NOTE — Discharge Instructions (Signed)
General Headache Without Cause A headache is pain or discomfort felt around the head or neck area. The specific cause of a headache may not be found. There are many causes and types of headaches. A few common ones are:  Tension headaches.  Migraine headaches.  Cluster headaches.  Chronic daily headaches. HOME CARE INSTRUCTIONS  Watch your condition for any changes. Take these steps to help with your condition: Managing Pain  Take over-the-counter and prescription medicines only as told by your health care provider.  Lie down in a dark, quiet room when you have a headache.  If directed, apply ice to the head and neck area:  Put ice in a plastic bag.  Place a towel between your skin and the bag.  Leave the ice on for 20 minutes, 2-3 times per day.  Use a heating pad or hot shower to apply heat to the head and neck area as told by your health care provider.  Keep lights dim if bright lights bother you or make your headaches worse. Eating and Drinking  Eat meals on a regular schedule.  Limit alcohol use.  Decrease the amount of caffeine you drink, or stop drinking caffeine. General Instructions  Keep all follow-up visits as told by your health care provider. This is important.  Keep a headache journal to help find out what may trigger your headaches. For example, write down:  What you eat and drink.  How much sleep you get.  Any change to your diet or medicines.  Try massage or other relaxation techniques.  Limit stress.  Sit up straight, and do not tense your muscles.  Do not use tobacco products, including cigarettes, chewing tobacco, or e-cigarettes. If you need help quitting, ask your health care provider.  Exercise regularly as told by your health care provider.  Sleep on a regular schedule. Get 7-9 hours of sleep, or the amount recommended by your health care provider. SEEK MEDICAL CARE IF:   Your symptoms are not helped by medicine.  You have a  headache that is different from the usual headache.  You have nausea or you vomit.  You have a fever. SEEK IMMEDIATE MEDICAL CARE IF:   Your headache becomes severe.  You have repeated vomiting.  You have a stiff neck.  You have a loss of vision.  You have problems with speech.  You have pain in the eye or ear.  You have muscular weakness or loss of muscle control.  You lose your balance or have trouble walking.  You feel faint or pass out.  You have confusion.   This information is not intended to replace advice given to you by your health care provider. Make sure you discuss any questions you have with your health care provider.   Document Released: 07/26/2005 Document Revised: 04/16/2015 Document Reviewed: 11/18/2014 Elsevier Interactive Patient Education 2016 [employer]   Tylenol and motrin otc as directed for headache

## 2016-07-02 ENCOUNTER — Encounter (HOSPITAL_COMMUNITY): Payer: Self-pay | Admitting: *Deleted

## 2016-07-02 ENCOUNTER — Ambulatory Visit (HOSPITAL_COMMUNITY)
Admission: EM | Admit: 2016-07-02 | Discharge: 2016-07-02 | Disposition: A | Discharge status: Home / Self Care | Payer: 59 | Attending: Emergency Medicine | Admitting: Emergency Medicine

## 2016-07-02 DIAGNOSIS — R42 Dizziness and giddiness: Secondary | ICD-10-CM

## 2016-07-02 DIAGNOSIS — T148XXA Other injury of unspecified body region, initial encounter: Secondary | ICD-10-CM

## 2016-07-02 DIAGNOSIS — G44209 Tension-type headache, unspecified, not intractable: Secondary | ICD-10-CM

## 2016-07-02 DIAGNOSIS — M545 Low back pain, unspecified: Secondary | ICD-10-CM

## 2016-07-02 HISTORY — DX: Essential (primary) hypertension: I10

## 2016-07-02 MED ORDER — CYCLOBENZAPRINE HCL 5 MG PO TABS: 5.0000 mg | ORAL_TABLET | Freq: Three times a day (TID) | ORAL | 0 refills | Status: DC | PRN

## 2016-07-02 NOTE — ED Provider Notes (Signed)
CSN: [id number]     Arrival date & time 07/02/16  1442 History   First MD Initiated Contact with Patient 07/02/16 1612     Chief Complaint  Patient presents with  . 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 Denies any known activity or injury that would have caused the pain. Her job requires prolonged sitting at a computer for several hours every day, sometimes without breaks. The pain is worse with movement, leaning forward, bending or twisting, leaning backwards or the act of sitting forward.  Second concern is that of a mild tension headache this started 4 days ago. It is described as "light". It is around her eyes. Denies problems with vision, speech, hearing, swallowing, focal paresthesias or weakness, nausea vomiting, vertigo or other neurologic problems.  Her other concern is that approximately 2 days ago she was driving and felt lightheaded to the point where she needed to pull over. After a few moments she states the lightheadedness when away and she felt better and was able to drive. The symptoms have not recurred since that time.      Past Medical History:  Diagnosis Date  . Bacterial vaginosis   . Hypertension    History reviewed. No pertinent surgical history. History reviewed. No pertinent family history. Social History  Substance Use Topics  . Smoking status: Never Smoker  . Smokeless tobacco: Never Used  . Alcohol use No   OB History    No data available     Review of Systems  Constitutional: Negative.  Negative for fever.  HENT: Negative.   Respiratory: Negative.   Cardiovascular: Negative for chest pain and leg swelling.  Gastrointestinal: Negative.  Negative for nausea and vomiting.  Genitourinary: Negative.   Neurological: Positive for light-headedness and  headaches. Negative for tremors, syncope, facial asymmetry, speech difficulty and numbness.  Psychiatric/Behavioral: Negative.   All other systems reviewed and are negative.   Allergies  Nsaids  Home Medications   Prior to Admission medications   Medication Sig Start Date End Date Taking? Authorizing Provider  acetaminophen (TYLENOL) 500 MG tablet Take 1,000 mg by mouth every 6 (six) hours as needed for pain. For pain    Historical Provider, MD  cyclobenzaprine (FLEXERIL) 5 MG tablet Take 1 tablet (5 mg total) by mouth 3 (three) times daily as needed for muscle spasms. 07/02/16   Hayden Rasmussenavid Carrson Lightcap, NP  fluticasone (FLONASE) 50 MCG/ACT nasal spray Place 2 sprays into both nostrils daily. 10/19/15   Deatra CanterWilliam J Oxford, FNP  fluticasone (FLONASE) 50 MCG/ACT nasal spray Place 2 sprays into both nostrils daily. 10/19/15   Deatra CanterWilliam J Oxford, FNP  Multiple Vitamin (MULTIVITAMIN WITH MINERALS) TABS tablet Take 1 tablet by mouth daily.    Historical Provider, MD   Meds Ordered and Administered this Visit  Medications - No data to display  BP 157/90 (BP Location: Right Arm)   Pulse 78   Temp 98.8 F (37.1 C) (Oral)   Resp 18   LMP 07/02/2016   SpO2 100%  No data found.   Physical Exam  Constitutional: She is oriented to person, place, and time. She appears well-developed and well-nourished. No distress.  HENT:  Head: Normocephalic and atraumatic.  Right Ear: External ear normal.  Left Ear: External ear normal.  Mouth/Throat: Oropharynx is clear and moist. No oropharyngeal exudate.  Eyes: Conjunctivae and EOM  are normal. Pupils are equal, round, and reactive to light. Right eye exhibits no discharge. Left eye exhibits no discharge.  Neck: Normal range of motion. Neck supple.  Cardiovascular: Normal rate, regular rhythm, normal heart sounds and intact distal pulses.   Pulmonary/Chest: Effort normal and breath sounds normal. No respiratory distress.  Abdominal: Soft. There is no tenderness.   Musculoskeletal: Normal range of motion. She exhibits no edema or tenderness.  Palpation and percussion across the lower back cannot reproduce the pain. Having the patient leaned backwards then leaned forward to reproduce the pain. While sitting and having the patient left her knee off the exam table produce pain in the lower back. Patient is able to stand straight leaned forward to approximately 30 before she feels discomfort across the lower back. Palpation of the spine reveals no deformity, swelling or tenderness.  Lymphadenopathy:    She has no cervical adenopathy.  Neurological: She is alert and oriented to person, place, and time. She has normal strength. She displays no tremor. No cranial nerve deficit or sensory deficit. She exhibits normal muscle tone. She displays a negative Romberg sign. Coordination and gait normal. GCS eye subscore is 4. GCS verbal subscore is 5. GCS motor subscore is 6.  Reflex Scores:      Patellar reflexes are 2+ on the right side and 2+ on the left side. Skin: Skin is warm and dry. No rash noted.  Psychiatric: She has a normal mood and affect.  Nursing note and vitals reviewed.   Urgent Care Course   Clinical Course     Procedures (including critical care time)  Labs Review Labs Reviewed - No data to display  Imaging Review No results found.   Visual Acuity Review  Right Eye Distance:   Left Eye Distance:   Bilateral Distance:    Right Eye Near:   Left Eye Near:    Bilateral Near:         MDM   1. Acute bilateral low back pain without sciatica   2. Muscle strain   3. Acute non intractable tension-type headache   4. Lightheadedness    You have a deep back muscle that is causing your pain. Apply heat and perform stretches as discussed. Do this frequently. Try to do this at work if you can as well as after work. Drink plenty of fluids and stay well-hydrated. He may take your muscle relaxant as needed but the air that it can cause  drowsiness. If you continue to develop episodes of lightheadedness follow-up with your primary care provider or a neurologist. Meds ordered this encounter  Medications  . cyclobenzaprine (FLEXERIL) 5 MG tablet    Sig: Take 1 tablet (5 mg total) by mouth 3 (three) times daily as needed for muscle spasms.    Dispense:  20 tablet    Refill:  0    Order Specific Question:   Supervising Provider    Answer:   Micheline ChapmanHONIG, ERIN J [[id number]]       Hayden Rasmussenavid Daylee Delahoz, NP 07/02/16 1651

## 2016-07-02 NOTE — Discharge Instructions (Signed)
You have a deep back muscle that is causing your pain. Apply heat and perform stretches as discussed. Do this frequently. Try to do this at work if you can as well as after work. Drink plenty of fluids and stay well-hydrated. He may take your muscle relaxant as needed but the air that it can cause drowsiness. If you continue to develop episodes of lightheadedness follow-up with your primary care provider or a neurologist.

## 2016-07-02 NOTE — ED Triage Notes (Signed)
Pt  Reports   Back  Pain   X  5  Days     No specefic  Injury   Pain  Is  Worse  In neck      Got  Dizzy     2  Days  Ago       Better  Now   Feels  Tension in  Head

## 2016-10-09 ENCOUNTER — Encounter (HOSPITAL_COMMUNITY): Payer: Self-pay | Admitting: Emergency Medicine

## 2016-10-09 ENCOUNTER — Emergency Department (HOSPITAL_COMMUNITY)
Admission: EM | Admit: 2016-10-09 | Discharge: 2016-10-09 | Disposition: A | Discharge status: Home / Self Care | Payer: Self-pay | Attending: Emergency Medicine | Admitting: Emergency Medicine

## 2016-10-09 ENCOUNTER — Emergency Department (HOSPITAL_COMMUNITY): Payer: Self-pay

## 2016-10-09 DIAGNOSIS — Z79899 Other long term (current) drug therapy: Secondary | ICD-10-CM | POA: Insufficient documentation

## 2016-10-09 DIAGNOSIS — M545 Low back pain, unspecified: Secondary | ICD-10-CM

## 2016-10-09 DIAGNOSIS — K5900 Constipation, unspecified: Secondary | ICD-10-CM | POA: Insufficient documentation

## 2016-10-09 DIAGNOSIS — I10 Essential (primary) hypertension: Secondary | ICD-10-CM | POA: Insufficient documentation

## 2016-10-09 LAB — CBC
HCT: 40.4 % (ref 36.0–46.0)
Hemoglobin: 13.3 g/dL (ref 12.0–15.0)
MCH: 27.9 pg (ref 26.0–34.0)
MCHC: 32.9 g/dL (ref 30.0–36.0)
MCV: 84.9 fL (ref 78.0–100.0)
PLATELETS: 166 10*3/uL (ref 150–400)
RBC: 4.76 MIL/uL (ref 3.87–5.11)
RDW: 13.2 % (ref 11.5–15.5)
WBC: 4.4 10*3/uL (ref 4.0–10.5)

## 2016-10-09 LAB — COMPREHENSIVE METABOLIC PANEL
ALT: 20 U/L (ref 14–54)
AST: 23 U/L (ref 15–41)
Albumin: 4.3 g/dL (ref 3.5–5.0)
Alkaline Phosphatase: 41 U/L (ref 38–126)
Anion gap: 8 (ref 5–15)
BUN: 14 mg/dL (ref 6–20)
CO2: 27 mmol/L (ref 22–32)
CREATININE: 1.04 mg/dL — AB (ref 0.44–1.00)
Calcium: 9.2 mg/dL (ref 8.9–10.3)
Chloride: 102 mmol/L (ref 101–111)
GFR calc non Af Amer: >60 mL/min (ref >60)
Glucose, Bld: 92 mg/dL (ref 65–99)
Potassium: 3.6 mmol/L (ref 3.5–5.1)
SODIUM: 137 mmol/L (ref 135–145)
Total Bilirubin: 0.3 mg/dL (ref 0.3–1.2)
Total Protein: 7.9 g/dL (ref 6.5–8.1)

## 2016-10-09 LAB — URINALYSIS, ROUTINE W REFLEX MICROSCOPIC
BACTERIA UA: NONE SEEN
Bilirubin Urine: NEGATIVE
Glucose, UA: NEGATIVE mg/dL
KETONES UR: NEGATIVE mg/dL
Leukocytes, UA: NEGATIVE
Nitrite: NEGATIVE
Protein, ur: NEGATIVE mg/dL
Specific Gravity, Urine: 1.019 (ref 1.005–1.030)
pH: 5 (ref 5.0–8.0)

## 2016-10-09 LAB — LIPASE, BLOOD: Lipase: 31 U/L (ref 11–51)

## 2016-10-09 LAB — PREGNANCY, URINE: PREG TEST UR: NEGATIVE

## 2016-10-09 NOTE — ED Triage Notes (Signed)
Pt complaint of constipation unrelieved by laxative for 3 days; pt verbalizes associated lower back pain without urinary symptoms, n/v/d.

## 2016-10-09 NOTE — Discharge Instructions (Signed)
It was our pleasure to provide your ER care today - we hope that you feel better.  Drink plenty of fluids. Get adequate fiber in diet.  Take colace (stool softener), and miralax (laxative) as need - both of these medications are available over the counter.   You may take ibuprofen and/or acetaminophen as need for pain.   Follow up with primary care doctor in the coming week.   Return to ER if worse, new symptoms, fevers, severe abdominal pain, persistent vomiting, other concern.

## 2016-10-09 NOTE — ED Provider Notes (Signed)
[facility]-EMERGENCY DEPT Provider Note   CSN: [id number] Arrival date & time: 10/09/16  1733     History   Chief Complaint Chief Complaint  Patient presents with  . Constipation    HPI Desiree Stewart is a 31 y.o. female.  Patient c/o low back pain, bilateral, for the past month. Pain dull, moderate, non radiating, without specific exacerbating or alleviating factors. Denies back injury or strain. 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 No acute or abrupt worsening of pain today.  States also w recent constipation. Normally has bm q day, in past week bm q 3 days. Occasional abd cramping. No constant/focal abd pain. No vomiting. Normal appetite. No dysuria or hematuria. Is on normal period now. No unusual vaginal discharge or bleeding. Denies hx fibroids. No fever or chills. w constipation, no narcotic use, only ibuprofen prn.    The history is provided by the patient.  Constipation   Pertinent negatives include no abdominal pain and no dysuria.    Past Medical History:  Diagnosis Date  . Bacterial vaginosis   . Hypertension     There are no active problems to display for this patient.   History reviewed. No pertinent surgical history.  OB History    No data available       Home Medications    Prior to Admission medications   Medication Sig Start Date End Date Taking? Authorizing Provider  acetaminophen (TYLENOL) 500 MG tablet Take 1,000 mg by mouth every 6 (six) hours as needed for pain. For pain    Historical Provider, MD  cyclobenzaprine (FLEXERIL) 5 MG tablet Take 1 tablet (5 mg total) by mouth 3 (three) times daily as needed for muscle spasms. 07/02/16   Hayden Rasmussenavid Mabe, NP  fluticasone (FLONASE) 50 MCG/ACT nasal spray Place 2 sprays into both nostrils daily. 10/19/15   Deatra CanterWilliam J Oxford, FNP  fluticasone (FLONASE) 50 MCG/ACT nasal spray Place 2 sprays into both nostrils daily. 10/19/15   Deatra CanterWilliam J Oxford, FNP  Multiple Vitamin (MULTIVITAMIN WITH MINERALS) TABS tablet Take 1 tablet by mouth  daily.    Historical Provider, MD    Family History No family history on file.  Social History Social History  Substance Use Topics  . Smoking status: Never Smoker  . Smokeless tobacco: Never Used  . Alcohol use No     Allergies   Nsaids   Review of Systems Review of Systems  Constitutional: Negative for chills and fever.  HENT: Negative for sore throat.   Eyes: Negative for redness.  Respiratory: Negative for cough and shortness of breath.   Cardiovascular: Negative for chest pain.  Gastrointestinal: Positive for constipation. Negative for abdominal pain and vomiting.  Genitourinary: Negative for dysuria, flank pain and vaginal discharge.  Musculoskeletal: Positive for back pain. Negative for neck pain.  Skin: Negative for rash.  Neurological: Negative for weakness, numbness and headaches.  Hematological: Does not bruise/bleed easily.  Psychiatric/Behavioral: Negative for confusion.     Physical Exam Updated Vital Signs BP 161/93 (BP Location: Right Arm)   Pulse 73   Temp 98.6 F (37 C)   Resp 12   Ht 5\' 5"  (1.651 m)   Wt 70.3 kg   LMP 10/08/2016   SpO2 100%   BMI 25.79 kg/m   Physical Exam  Constitutional: She appears well-developed and well-nourished. No distress.  HENT:  Mouth/Throat: Oropharynx is clear and moist.  Eyes: Conjunctivae are normal. No scleral icterus.  Neck: Neck supple. No tracheal deviation present.  Cardiovascular: Normal rate, regular rhythm, normal  heart sounds and intact distal pulses.  Exam reveals no gallop and no friction rub.   No murmur heard. Pulmonary/Chest: Effort normal and breath sounds normal. No respiratory distress.  Abdominal: Soft. Normal appearance and bowel sounds are normal. She exhibits no distension and no mass. There is no tenderness. There is no rebound and no guarding.  Genitourinary:  Genitourinary Comments: No cva tenderness  Musculoskeletal: She exhibits no edema.  Neurological: She is alert.  Speech  fluent. Ambulates w steady gait.   Skin: Skin is warm and dry. No rash noted. She is not diaphoretic.  Psychiatric: She has a normal mood and affect.  Nursing note and vitals reviewed.    ED Treatments / Results  Labs (all labs ordered are listed, but only abnormal results are displayed) Results for orders placed or performed during the hospital encounter of 10/09/16  Urinalysis, Routine w reflex microscopic  Result Value Ref Range   Color, Urine YELLOW YELLOW   APPearance CLEAR CLEAR   Specific Gravity, Urine 1.019 1.005 - 1.030   pH 5.0 5.0 - 8.0   Glucose, UA NEGATIVE NEGATIVE mg/dL   Hgb urine dipstick SMALL (A) NEGATIVE   Bilirubin Urine NEGATIVE NEGATIVE   Ketones, ur NEGATIVE NEGATIVE mg/dL   Protein, ur NEGATIVE NEGATIVE mg/dL   Nitrite NEGATIVE NEGATIVE   Leukocytes, UA NEGATIVE NEGATIVE   RBC / HPF 0-5 0 - 5 RBC/hpf   WBC, UA 0-5 0 - 5 WBC/hpf   Bacteria, UA NONE SEEN NONE SEEN   Squamous Epithelial / LPF 0-5 (A) NONE SEEN   Mucous PRESENT   Pregnancy, urine  Result Value Ref Range   Preg Test, Ur NEGATIVE NEGATIVE  CBC  Result Value Ref Range   WBC 4.4 4.0 - 10.5 K/uL   RBC 4.76 3.87 - 5.11 MIL/uL   Hemoglobin 13.3 12.0 - 15.0 g/dL   HCT 16.1 09.6 - 04.5 %   MCV 84.9 78.0 - 100.0 fL   MCH 27.9 26.0 - 34.0 pg   MCHC 32.9 30.0 - 36.0 g/dL   RDW 40.9 81.1 - 91.4 %   Platelets 166 150 - 400 K/uL  Comprehensive metabolic panel  Result Value Ref Range   Sodium 137 135 - 145 mmol/L   Potassium 3.6 3.5 - 5.1 mmol/L   Chloride 102 101 - 111 mmol/L   CO2 27 22 - 32 mmol/L   Glucose, Bld 92 65 - 99 mg/dL   BUN 14 6 - 20 mg/dL   Creatinine, Ser 7.82 (H) 0.44 - 1.00 mg/dL   Calcium 9.2 8.9 - 95.6 mg/dL   Total Protein 7.9 6.5 - 8.1 g/dL   Albumin 4.3 3.5 - 5.0 g/dL   AST 23 15 - 41 U/L   ALT 20 14 - 54 U/L   Alkaline Phosphatase 41 38 - 126 U/L   Total Bilirubin 0.3 0.3 - 1.2 mg/dL   GFR calc non Af Amer >60 >60 mL/min   GFR calc Af Amer >60 >60 mL/min    Anion gap 8 5 - 15  Lipase, blood  Result Value Ref Range   Lipase 31 11 - 51 U/L   Dg Abd 1 View  Result Date: 10/09/2016 CLINICAL DATA:  Abdominal/flank pain.  Constipation. EXAM: ABDOMEN - 1 VIEW COMPARISON:  None. FINDINGS: Normal bowel gas pattern. No evidence of free air. Moderate stool in the ascending and descending colon. No bowel dilatation to suggest obstruction. No radiopaque calculi. Normal osseous structures. IMPRESSION: Normal bowel gas pattern. Moderate stool  in the ascending and descending colon. Electronically Signed   By: Rubye Oaks M.D.   On: 10/09/2016 20:30    EKG  EKG Interpretation None       Radiology Dg Abd 1 View  Result Date: 10/09/2016 CLINICAL DATA:  Abdominal/flank pain.  Constipation. EXAM: ABDOMEN - 1 VIEW COMPARISON:  None. FINDINGS: Normal bowel gas pattern. No evidence of free air. Moderate stool in the ascending and descending colon. No bowel dilatation to suggest obstruction. No radiopaque calculi. Normal osseous structures. IMPRESSION: Normal bowel gas pattern. Moderate stool in the ascending and descending colon. Electronically Signed   By: Rubye Oaks M.D.   On: 10/09/2016 20:30    Procedures Procedures (including critical care time)  Medications Ordered in ED Medications - No data to display   Initial Impression / Assessment and Plan / ED Course  I have reviewed the triage vital signs and the nursing notes.  Pertinent labs & imaging results that were available during my care of the patient were reviewed by me and considered in my medical decision making (see chart for details).  Labs. Xrays.  Reviewed nursing notes and prior charts for additional history.   Xray w mod stool, no obstruction. Labs unremarkable. abd soft nt. Spine nt.   Patient currently appears stable for d/c.     Final Clinical Impressions(s) / ED Diagnoses   Final diagnoses:  None    New Prescriptions New Prescriptions   No medications on file      Cathren Laine, MD 10/10/16 0030

## 2017-04-02 ENCOUNTER — Ambulatory Visit (INDEPENDENT_AMBULATORY_CARE_PROVIDER_SITE_OTHER): Payer: 59 | Admitting: Physician Assistant

## 2017-04-02 ENCOUNTER — Encounter: Payer: Self-pay | Admitting: Physician Assistant

## 2017-04-02 VITALS — BP 138/94 | HR 94 | Temp 98.6°F | Resp 16 | Ht 65.5 in | Wt 163.2 lb

## 2017-04-02 DIAGNOSIS — G8929 Other chronic pain: Secondary | ICD-10-CM

## 2017-04-02 DIAGNOSIS — M549 Dorsalgia, unspecified: Secondary | ICD-10-CM

## 2017-04-02 DIAGNOSIS — F411 Generalized anxiety disorder: Secondary | ICD-10-CM | POA: Diagnosis not present

## 2017-04-02 DIAGNOSIS — R000 Tachycardia, unspecified: Secondary | ICD-10-CM

## 2017-04-02 DIAGNOSIS — M419 Scoliosis, unspecified: Secondary | ICD-10-CM

## 2017-04-02 MED ORDER — PROPRANOLOL HCL 40 MG PO TABS: 40.0000 mg | ORAL_TABLET | Freq: Every day | ORAL | 1 refills | Status: DC | PRN

## 2017-04-02 NOTE — Progress Notes (Signed)
PRIMARY CARE AT [facility] [address], [place] [address] [phone]  Date:  04/02/2017   Name:  Desiree Stewart   DOB:  07/19/1986   MRN:  [id number]  PCP:  Desiree Stewart    History of Present Illness:  Desiree Stewart is a 31 y.o. female patient who presents to PCP with  Chief Complaint  Patient presents with  . Establish Care    anxiety     Patient is here to establish care and to address anxiety symptoms.   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   She has noticed during times of presentations, which she has to do multiple times Stewart week, she feels extreme anxiety.  This includes her heart poounding, sob, tremulous, choking sensation, voice changes, and has to get her breath out.   She is able to complete it, but this sensation is making her dread her work.   She does not recall having this before.   She notes no hx of anxiety.  No difficulty in the past with test taking, or evaluating assignmenents.  There is some social angst, where she has also felt like an introvert.   No history of thyroid disease No hx of anemia No hx of ptsd, intrusive thoughts She feels that she is overthiking everything.   No illicit drug use No etOH use.   She also complains of back pain.  She has a hx of scoliosis.  She has right sided back pain that radiates around right lower area.  No numbness or tingling.  It was recognized in her teens, and near menses--subjectively would not change.  She is currently not exercising.   There are no active problems to display for this patient.   Past Medical History:  Diagnosis Date  . Bacterial vaginosis   . Hypertension     History reviewed. No pertinent surgical history.  Social History  Substance Use Topics  . Smoking status: Never Smoker  . Smokeless tobacco: Never Used  . Alcohol use No    Family History  Problem Relation Age of Onset  . Breast cancer Mother   . Hypertension Mother      Allergies  Allergen Reactions  . Nsaids Hives    IBU "sometimes" causing eye itching & swelling    Medication list has been reviewed and updated.  No current outpatient prescriptions on file prior to visit.   No current facility-administered medications on file prior to visit.     ROS ROS otherwise unremarkable unless listed above.  Physical Examination: BP (!) 138/94 (BP Location: Left Arm, Cuff Size: Normal)   Pulse 94   Temp 98.6 F (37 C) (Oral)   Resp 16   Ht 5' 5.5" (1.664 m)   Wt 163 lb 3.2 oz (74 kg)   LMP 03/27/2017   SpO2 100%   BMI 26.74 kg/m  Ideal Body Weight: Weight in (lb) to have BMI = 25: 152.2  Physical Exam  Constitutional: She is oriented to person, place, and time. She appears well-developed and well-nourished. No distress.  HENT:  Head: Normocephalic and atraumatic.  Right Ear: External ear normal.  Left Ear: External ear normal.  Eyes: Pupils are equal, round, and reactive to light. Conjunctivae and EOM are normal.  Cardiovascular: Normal rate, regular rhythm and normal heart sounds.  Exam reveals no gallop and no friction rub.   No murmur heard. Pulmonary/Chest: Effort normal. No respiratory distress.  Musculoskeletal:  Some visual appearance of curvature at ht elower thoracic/lumbar.  Normal rom.    Neurological: She is alert and oriented to person, place, and time.  Skin: She is not diaphoretic.  Psychiatric: She has a normal mood and affect. Her behavior is normal.     Assessment and Plan: Desiree Stewart is a 31 y.o. female who is here today for cc of anxiety, scoliosis, and right sided back pain.  She would like more alternative therapy, and I am open to this.  Referring to chiropractor.   Will rule out blood work to be cause of her anxiety.  Starting on a propranol to rid that fight or flight response.  May consider benzo if this is not working.  This appears to be at presentations mainly.   She will let me know how this is  working.   bp at home 128/80 and generally low when checked outside of medical area.  I think this is likely white coat. She will watch this outside.  Anxiety state - Plan: TSH, CBC  Racing heart beat - Plan: TSH, CBC  Chronic right-sided back pain, unspecified back location - Plan: Ambulatory referral to Chiropractic  Scoliosis, unspecified scoliosis type, unspecified spinal region - Plan: Ambulatory referral to Chiropractic  [name], PA-C [facility] 9/14/20189:49 AM

## 2017-04-02 NOTE — Patient Instructions (Addendum)
Please await referral for chiropractor consult. I would like you to try the propanol at least twice, with 2 events.  Make sure you are hydrating well during this day If this is not working, please contact me, and we can change to another medication we have discussed.    How to cope with anxiety Dealing with stress Stress is your body's reaction to life changes and events, both good and bad. Stress can last just a few hours or it can be ongoing. Stress can play a major role in anxiety, so it is important to learn both how to cope with stress and how to think about it differently. Talk with your health care provider or a counselor to learn more about stress reduction. He or she may suggest some stress reduction techniques, such as:  Music therapy. This can include creating or listening to music that you enjoy and that inspires you.  Mindfulness-based meditation. This involves being aware of your normal breaths, rather than trying to control your breathing. It can be done while sitting or walking.  Centering prayer. This is a kind of meditation that involves focusing on a word, phrase, or sacred image that is meaningful to you and that brings you peace.  Deep breathing. To do this, expand your stomach and inhale slowly through your nose. Hold your breath for 3-5 seconds. Then exhale slowly, allowing your stomach muscles to relax.  Self-talk. This is a skill where you identify thought patterns that lead to anxiety reactions and correct those thoughts.  Muscle relaxation. This involves tensing muscles then relaxing them.  Choose a stress reduction technique that fits your lifestyle and personality. Stress reduction techniques take time and practice. Set aside 5-15 minutes a day to do them. Therapists can offer training in these techniques. The training may be covered by some insurance plans. Other things you can do to manage stress include:  Keeping a stress diary. This can help you learn what  triggers your stress and ways to control your response.  Thinking about how you respond to certain situations. You may not be able to control everything, but you can control your reaction.  Making time for activities that help you relax, and not feeling guilty about spending your time in this way.  Therapy combined with coping and stress-reduction skills provides the best chance for successful treatment. Medicines Medicines can help ease symptoms. Medicines for anxiety include:  Anti-anxiety drugs.  Antidepressants.  Beta-blockers.  Medicines may be used as the main treatment for anxiety disorder, along with therapy, or if other treatments are not working. Medicines should be prescribed by a health care provider. Relationships Relationships can play a big part in helping you recover. Try to spend more time connecting with trusted friends and family members. Consider going to couples counseling, taking family education classes, or going to family therapy. Therapy can help you and others better understand the condition. How to recognize changes in your condition Everyone has a different response to treatment for anxiety. Recovery from anxiety happens when symptoms decrease and stop interfering with your daily activities at home or work. This may mean that you will start to:  Have better concentration and focus.  Sleep better.  Be less irritable.  Have more energy.  Have improved memory.  It is important to recognize when your condition is getting worse. Contact your health care provider if your symptoms interfere with home or work and you do not feel like your condition is improving. Where to find help  and support: You can get help and support from these sources:  Self-help groups.  Online and [employer].  A trusted spiritual leader.  Couples counseling.  Family education classes.  Family therapy.  Follow these instructions at home:  Eat a healthy diet  that includes plenty of vegetables, fruits, whole grains, low-fat dairy products, and lean protein. Do not eat a lot of foods that are high in solid fats, added sugars, or salt.  Exercise. Most adults should do the following: ? Exercise for at least 150 minutes each week. The exercise should increase your heart rate and make you sweat (moderate-intensity exercise). ? Strengthening exercises at least twice a week.  Cut down on caffeine, tobacco, alcohol, and other potentially harmful substances.  Get the right amount and quality of sleep. Most adults need 7-9 hours of sleep each night.  Make choices that simplify your life.  Take over-the-counter and prescription medicines only as told by your health care provider.  Avoid caffeine, alcohol, and certain over-the-counter cold medicines. These may make you feel worse. Ask your pharmacist which medicines to avoid.  Keep all follow-up visits as told by your health care provider. This is important. Questions to ask your health care provider  Would I benefit from therapy?  How often should I follow up with a health care provider?  How long do I need to take medicine?  Are there any long-term side effects of my medicine?  Are there any alternatives to taking medicine? Contact a health care provider if:  You have a hard time staying focused or finishing daily tasks.  You spend many hours a day feeling worried about everyday life.  You become exhausted by worry.  You start to have headaches, feel tense, or have nausea.  You urinate more than normal.  You have diarrhea. Get help right away if:  You have a racing heart and shortness of breath.  You have thoughts of hurting yourself or others. If you ever feel like you may hurt yourself or others, or have thoughts about taking your own life, get help right away. You can go to your nearest emergency department or call:  Your local emergency services (911 in the [place]  A suicide  crisis helpline, such as the National Suicide Prevention Lifeline at 907-564-9810. This is open 24-hours a day.  Summary  Taking steps to deal with stress can help calm you.  Medicines cannot cure anxiety disorders, but they can help ease symptoms.  Family, friends, and partners can play a big part in helping you recover from an anxiety disorder. This information is not intended to replace advice given to you by your health care provider. Make sure you discuss any questions you have with your health care provider. Document Released: 07/20/2016 Document Revised: 07/20/2016 Document Reviewed: 07/20/2016 Elsevier Interactive Patient Education  2018 [employer].     IF you received an x-ray today, you will receive an invoice from [facility]. Please contact [facility] at [phone] with questions or concerns regarding your invoice.   IF you received labwork today, you will receive an invoice from [facility]. Please contact [facility] at [phone] with questions or concerns regarding your invoice.   Our billing staff will not be able to assist you with questions regarding bills from these companies.  You will be contacted with the lab results as soon as they are available. The fastest way to get your results is to activate your My Chart account. Instructions are located on the last page  of this paperwork. If you have not heard from [place] regarding the results in 2 weeks, please contact this office.

## 2017-04-03 LAB — CBC
Hematocrit: 40.6 % (ref 34.0–46.6)
Hemoglobin: 13.1 g/dL (ref 11.1–15.9)
MCH: 28.5 pg (ref 26.6–33.0)
MCHC: 32.3 g/dL (ref 31.5–35.7)
MCV: 88 fL (ref 79–97)
Platelets: 186 10*3/uL (ref 150–379)
RBC: 4.6 x10E6/uL (ref 3.77–5.28)
RDW: 14.1 % (ref 12.3–15.4)
WBC: 4.7 10*3/uL (ref 3.4–10.8)

## 2017-04-03 LAB — TSH: TSH: 1 u[IU]/mL (ref 0.450–4.500)

## 2017-05-11 ENCOUNTER — Encounter: Payer: Self-pay | Admitting: Physician Assistant

## 2017-05-11 ENCOUNTER — Ambulatory Visit (INDEPENDENT_AMBULATORY_CARE_PROVIDER_SITE_OTHER): Payer: 59

## 2017-05-11 ENCOUNTER — Ambulatory Visit (INDEPENDENT_AMBULATORY_CARE_PROVIDER_SITE_OTHER): Payer: 59 | Admitting: Physician Assistant

## 2017-05-11 VITALS — HR 100 | Temp 98.6°F | Resp 16 | Ht 65.5 in | Wt 164.4 lb

## 2017-05-11 DIAGNOSIS — M4124 Other idiopathic scoliosis, thoracic region: Secondary | ICD-10-CM | POA: Diagnosis not present

## 2017-05-11 DIAGNOSIS — F410 Panic disorder [episodic paroxysmal anxiety] without agoraphobia: Secondary | ICD-10-CM | POA: Diagnosis not present

## 2017-05-11 MED ORDER — FLUOXETINE HCL 20 MG PO TABS: 20.0000 mg | ORAL_TABLET | Freq: Every day | ORAL | 1 refills | Status: DC

## 2017-05-11 MED ORDER — ALPRAZOLAM 0.25 MG PO TABS: 0.2500 mg | ORAL_TABLET | Freq: Every day | ORAL | 0 refills | Status: DC | PRN

## 2017-05-11 MED ORDER — LIDOCAINE 5 % EX PTCH: 1.0000 | MEDICATED_PATCH | CUTANEOUS | 1 refills | Status: DC

## 2017-05-11 NOTE — Patient Instructions (Signed)
We can try the lidoderm patch to see if this does not provoke allergic symptoms.  I am referring you to orthopedist, please await this referral. I am starting you on prozac.  Take half tablet for first week, then increase to 1 full tablet the next week.   You can use the alprazolam as needed for anxiety during meetings. Psychologist are listed below, and the website if you want to look for yourself, is psychologytoday.com [facility] [address] [place], [place] [address]   Shanon Rosser [phone]  Maris Berger [phone]  Marco Collie [phone]   [facility] ([address] [place], [place] Joycelyn Schmid [place]) - [phone]  [facility] Ho[facility] [place]) - [phone]  [facility] - [phone]  [facility] - [phone]  Buena Irish - [phone]  [facility]  - [phone] (do not file insurance)

## 2017-05-11 NOTE — Progress Notes (Signed)
PRIMARY CARE AT [facility] [address] [place] [address] [phone]  Date:  05/11/2017   Name:  Dalynn Jhaveri   DOB:  [birth year]   MRN:  [id number]  PCP:  Patient, No Pcp Per    History of Present Illness:  Mattie Dandridge is a 31 y.o. female patient who presents to PCP with  Chief Complaint  Patient presents with  . Back Pain    not any better   . Anxiety    has gotten a little better       Anxiety has improved with her medications She has noted depressed mood.  Anhedonia, no change in sleep, feelings of guilt that she has so much fatigue with her children.  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  No is/hi  She developed rash with the propanol. It works, so she continued to use it 3x per week during her presentations. She notes that she has not been followed for her scoliosis.  She continues to have back pain more so at the right mid back.    There are no active problems to display for this patient.   Past Medical History:  Diagnosis Date  . Bacterial vaginosis   . Hypertension     History reviewed. No pertinent surgical history.  Social History  Substance Use Topics  . Smoking status: Never Smoker  . Smokeless tobacco: Never Used  . Alcohol use No    Family History  Problem Relation Age of Onset  . Breast cancer Mother   . Hypertension Mother     Allergies  Allergen Reactions  . Nsaids Hives    IBU "sometimes" causing eye itching & swelling    Medication list has been reviewed and updated.  Current Outpatient Prescriptions on File Prior to Visit  Medication Sig Dispense Refill  . propranolol (INDERAL) 40 MG tablet Take 1 tablet (40 mg total) by mouth daily as needed. Take 60-90 minutes prior to anxiety-provoking event 30 tablet 1   No current facility-administered medications on file prior to visit.     ROS ROS otherwise unremarkable unless listed above.  Physical Examination: Pulse 100   Temp 98.6 F (37 C)   Resp 16   Ht 5' 5.5"  (1.664 m)   Wt 164 lb 6.4 oz (74.6 kg)   SpO2 100%   BMI 26.94 kg/m  Ideal Body Weight: Weight in (lb) to have BMI = 25: 152.2  Physical Exam  Constitutional: She is oriented to person, place, and time. She appears well-developed and well-nourished. No distress.  HENT:  Head: Normocephalic and atraumatic.  Right Ear: External ear normal.  Left Ear: External ear normal.  Eyes: Pupils are equal, round, and reactive to light. Conjunctivae and EOM are normal.  Neck: No thyroid mass and no thyromegaly present.  Cardiovascular: Normal rate.   Pulmonary/Chest: Effort normal. No respiratory distress. She has no decreased breath sounds. She has no wheezes. She has no rhonchi.  Musculoskeletal:  Scoliosis apparent  Neurological: She is alert and oriented to person, place, and time.  Skin: She is not diaphoretic.  Psychiatric: She has a normal mood and affect. Her behavior is normal.     Assessment and Plan: Marlaya Yeung is a 31 y.o. female who is here today for cc of  Chief Complaint  Patient presents with  . Back Pain    not any better   . Anxiety    has gotten a little better   ortho referral appreciated at this time.  She is heavy chested and evaluation of the scoliosis is advantageous in moving into improving back pain as well as steps to improve this, and if not perhaps breast reduction may be improvement solution.   Give Prozac .5 tablet daily 1 week increase to full tablet.  Follow up in 5 weeks Xanax to bridge, however may be the improvement for her presentation Advised to return in 1 week if rash does not resolve.  Advised OTC hydrocortisone at this time.   Other idiopathic scoliosis, thoracic region - Plan: DG Thoracic Spine 2 View, AMB referral to orthopedics  Panic state - Plan: ALPRAZolam (XANAX) 0.25 MG tablet, DISCONTINUED: ALPRAZolam (XANAX) 0.25 MG tablet  [name], PA-C [facility] 10/7/201811:12 AM

## 2017-06-25 ENCOUNTER — Ambulatory Visit: Payer: 59 | Admitting: Physician Assistant

## 2017-06-25 ENCOUNTER — Encounter: Payer: Self-pay | Admitting: Physician Assistant

## 2017-06-25 ENCOUNTER — Other Ambulatory Visit: Payer: Self-pay

## 2017-06-25 VITALS — BP 130/90 | HR 66 | Temp 98.6°F | Resp 16 | Ht 66.25 in | Wt 159.4 lb

## 2017-06-25 DIAGNOSIS — M4124 Other idiopathic scoliosis, thoracic region: Secondary | ICD-10-CM

## 2017-06-25 DIAGNOSIS — F411 Generalized anxiety disorder: Secondary | ICD-10-CM | POA: Diagnosis not present

## 2017-06-25 MED ORDER — DULOXETINE HCL 30 MG PO CPEP: 30.0000 mg | ORAL_CAPSULE | Freq: Every day | ORAL | 1 refills | Status: DC

## 2017-06-25 NOTE — Progress Notes (Signed)
[facility] [address], [place] [id number] [phone]  Date:  06/25/2017   Name:  Isac SarnaMelisa Vendetti   DOB:  07/31/1986   MRN:  [id number]  PCP:  Patient, No Pcp Per    History of Present Illness:  Jezel Amie CritchleyWalson is a 31 y.o. female patient who presents to PCP with  Chief Complaint  Patient presents with  . Back Pain    mid right side of back, all symptoms"ongoing"  . neck    central  . shoulders    right side  . Anxiety    f/u "Xanax makes me feel weird"     She is not using the medication at this time. She states that both the Prozac and the xanax made her feel weird.  She did take both at different days, but she found this to make her feel not like herself.  She does not wish to take this medication at this time.   She stated that she restarted the propranolol and has found much better results with her anxiety.  She wishes to continue this. She is at orthopedics for the scoliosis.  They are recommending physical therapy, but do note that her breast size is more of a culprit to her back pain.    There are no active problems to display for this patient.   Past Medical History:  Diagnosis Date  . Bacterial vaginosis   . Hypertension     No past surgical history on file.  Social History   Tobacco Use  . Smoking status: Never Smoker  . Smokeless tobacco: Never Used  Substance Use Topics  . Alcohol use: No  . Drug use: No    Family History  Problem Relation Age of Onset  . Breast cancer Mother   . Hypertension Mother     Allergies  Allergen Reactions  . Nsaids Hives    IBU "sometimes" causing eye itching & swelling    Medication list has been reviewed and updated.  Current Outpatient Medications on File Prior to Visit  Medication Sig Dispense Refill  . lidocaine (LIDODERM) 5 % Place 1 patch onto the skin daily. Remove & Discard patch within 12 hours or as directed by MD 30 patch 1  . propranolol (INDERAL) 40 MG tablet Take 1 tablet (40 mg  total) by mouth daily as needed. Take 60-90 minutes prior to anxiety-provoking event 30 tablet 1  . ALPRAZolam (XANAX) 0.25 MG tablet Take 1-2 tablets (0.25-0.5 mg total) by mouth daily as needed for anxiety. Take 60 minutes prior to anxiety provoking event. (Patient not taking: Reported on 06/25/2017) 30 tablet 0  . FLUoxetine (PROZAC) 20 MG tablet Take 1 tablet (20 mg total) by mouth daily. Start with half tablet daily for one week, then increase to full tablet daily. (Patient not taking: Reported on 06/25/2017) 30 tablet 1   No current facility-administered medications on file prior to visit.     ROS ROS otherwise unremarkable unless listed above.  Physical Examination: BP 130/90   Pulse 66   Temp 98.6 F (37 C) (Oral)   Resp 16   Ht 5' 6.25" (1.683 m)   Wt 159 lb 6.4 oz (72.3 kg)   LMP 06/23/2017   SpO2 99%   BMI 25.53 kg/m  Ideal Body Weight: Weight in (lb) to have BMI = 25: 155.7  Physical Exam  Constitutional: She is oriented to person, place, and time. She appears well-developed and well-nourished. No distress.  HENT:  Head: Normocephalic  and atraumatic.  Right Ear: External ear normal.  Left Ear: External ear normal.  Eyes: Conjunctivae and EOM are normal. Pupils are equal, round, and reactive to light.  Cardiovascular: Normal rate.  Pulmonary/Chest: Effort normal. No respiratory distress.  Neurological: She is alert and oriented to person, place, and time.  Skin: She is not diaphoretic.  Psychiatric: She has a normal mood and affect. Her behavior is normal.     Assessment and Plan: Hailyn Amie CritchleyWalson is a 31 y.o. female who is here today for cc of  Chief Complaint  Patient presents with  . Back Pain    mid right side of back, all symptoms"ongoing"  . neck    central  . shoulders    right side  . Anxiety    f/u "Xanax makes me feel weird"   --continue the propranolol --we will also add duloxetine.  This may not have the repeated side effects of the Prozac.   She will contact me if this continues to make her "feel weird" within the next 2 weeks.  Will consider switching to a TCA at that time, and return in 4 weeks for recheck.  --duloxetine may also improve back pain symptoms. Anxiety state  Other idiopathic scoliosis, thoracic region  Trena PlattStephanie Yvan Dority, PA-C [facility] 11/24/201810:11 AM

## 2017-06-25 NOTE — Patient Instructions (Addendum)
We are going to stop the prozac and the xanax.  We will continue the betablocker.   I am adding cymbalta.  If you are having the same side effects as the prozac, you can contact me via email.   We will then look at another treatment plan.   Listed below, is information of cymbalta.  Duloxetine delayed-release capsules What is this medicine? DULOXETINE (doo LOX e teen) is used to treat depression, anxiety, and different types of chronic pain. This medicine may be used for other purposes; ask your health care provider or pharmacist if you have questions. COMMON BRAND NAME(S): Cymbalta, Irenka What should I tell my health care provider before I take this medicine? They need to know if you have any of these conditions: -bipolar disorder or a family history of bipolar disorder -glaucoma -kidney disease -liver disease -suicidal thoughts or a previous suicide attempt -taken medicines called MAOIs like Carbex, Eldepryl, Marplan, Nardil, and Parnate within 14 days -an unusual reaction to duloxetine, other medicines, foods, dyes, or preservatives -pregnant or trying to get pregnant -breast-feeding How should I use this medicine? Take this medicine by mouth with a glass of water. Follow the directions on the prescription label. Do not cut, crush or chew this medicine. You can take this medicine with or without food. Take your medicine at regular intervals. Do not take your medicine more often than directed. Do not stop taking this medicine suddenly except upon the advice of your doctor. Stopping this medicine too quickly may cause serious side effects or your condition may worsen. A special MedGuide will be given to you by the pharmacist with each prescription and refill. Be sure to read this information carefully each time. Talk to your pediatrician regarding the use of this medicine in children. While this drug may be prescribed for children as young as 32 years of age for selected conditions,  precautions do apply. Overdosage: If you think you have taken too much of this medicine contact a poison control center or emergency room at once. NOTE: This medicine is only for you. Do not share this medicine with others. What if I miss a dose? If you miss a dose, take it as soon as you can. If it is almost time for your next dose, take only that dose. Do not take double or extra doses. What may interact with this medicine? Do not take this medicine with any of the following medications: -desvenlafaxine -levomilnacipran -linezolid -MAOIs like Carbex, Eldepryl, Marplan, Nardil, and Parnate -methylene blue (injected into a vein) -milnacipran -thioridazine -venlafaxine This medicine may also interact with the following medications: -alcohol -amphetamines -aspirin and aspirin-like medicines -certain antibiotics like ciprofloxacin and enoxacin -certain medicines for blood pressure, heart disease, irregular heart beat -certain medicines for depression, anxiety, or psychotic disturbances -certain medicines for migraine headache like almotriptan, eletriptan, frovatriptan, naratriptan, rizatriptan, sumatriptan, zolmitriptan -certain medicines that treat or prevent blood clots like warfarin, enoxaparin, and dalteparin -cimetidine -fentanyl -lithium -NSAIDS, medicines for pain and inflammation, like ibuprofen or naproxen -phentermine -procarbazine -rasagiline -sibutramine -St. John's wort -theophylline -tramadol -tryptophan This list may not describe all possible interactions. Give your health care provider a list of all the medicines, herbs, non-prescription drugs, or dietary supplements you use. Also tell them if you smoke, drink alcohol, or use illegal drugs. Some items may interact with your medicine. What should I watch for while using this medicine? Tell your doctor if your symptoms do not get better or if they get worse. Visit your doctor or  health care professional for regular  checks on your progress. Because it may take several weeks to see the full effects of this medicine, it is important to continue your treatment as prescribed by your doctor. Patients and their families should watch out for new or worsening thoughts of suicide or depression. Also watch out for sudden changes in feelings such as feeling anxious, agitated, panicky, irritable, hostile, aggressive, impulsive, severely restless, overly excited and hyperactive, or not being able to sleep. If this happens, especially at the beginning of treatment or after a change in dose, call your health care professional. Bonita Quin may get drowsy or dizzy. Do not drive, use machinery, or do anything that needs mental alertness until you know how this medicine affects you. Do not stand or sit up quickly, especially if you are an older patient. This reduces the risk of dizzy or fainting spells. Alcohol may interfere with the effect of this medicine. Avoid alcoholic drinks. This medicine can cause an increase in blood pressure. This medicine can also cause a sudden drop in your blood pressure, which may make you feel faint and increase the chance of a fall. These effects are most common when you first start the medicine or when the dose is increased, or during use of other medicines that can cause a sudden drop in blood pressure. Check with your doctor for instructions on monitoring your blood pressure while taking this medicine. Your mouth may get dry. Chewing sugarless gum or sucking hard candy, and drinking plenty of water may help. Contact your doctor if the problem does not go away or is severe. What side effects may I notice from receiving this medicine? Side effects that you should report to your doctor or health care professional as soon as possible: -allergic reactions like skin rash, itching or hives, swelling of the face, lips, or tongue -anxious -breathing problems -confusion -changes in vision -chest  pain -confusion -elevated mood, decreased need for sleep, racing thoughts, impulsive behavior -eye pain -fast, irregular heartbeat -feeling faint or lightheaded, falls -feeling agitated, angry, or irritable -hallucination, loss of contact with reality -high blood pressure -loss of balance or coordination -palpitations -redness, blistering, peeling or loosening of the skin, including inside the mouth -restlessness, pacing, inability to keep still -seizures -stiff muscles -suicidal thoughts or other mood changes -trouble passing urine or change in the amount of urine -trouble sleeping -unusual bleeding or bruising -unusually weak or tired -vomiting -yellowing of the eyes or skin Side effects that usually do not require medical attention (report to your doctor or health care professional if they continue or are bothersome): -change in sex drive or performance -change in appetite or weight -constipation -dizziness -dry mouth -headache -increased sweating -nausea -tired This list may not describe all possible side effects. Call your doctor for medical advice about side effects. You may report side effects to FDA at 1-800-FDA-1088. Where should I keep my medicine? Keep out of the reach of children. Store at room temperature between 20 and 25 degrees C (68 to 77 degrees F). Throw away any unused medicine after the expiration date. NOTE: This sheet is a summary. It may not cover all possible information. If you have questions about this medicine, talk to your doctor, pharmacist, or health care provider.  2018 Elsevier/Gold Standard (2015-12-25 18:16:03)      IF you received an x-ray today, you will receive an invoice from [facility]. Please contact [facility] at [phone] with questions or concerns regarding your invoice.   IF  you received labwork today, you will receive an invoice from [facility]. Please contact [facility] at [phone] with questions or  concerns regarding your invoice.   Our billing staff will not be able to assist you with questions regarding bills from these companies.  You will be contacted with the lab results as soon as they are available. The fastest way to get your results is to activate your My Chart account. Instructions are located on the last page of this paperwork. If you have not heard from us regarding the results in 2 weeks, please contact this office.

## 2017-07-07 ENCOUNTER — Other Ambulatory Visit: Payer: Self-pay | Admitting: Physician Assistant

## 2017-07-07 ENCOUNTER — Encounter: Payer: Self-pay | Admitting: Physician Assistant

## 2017-07-07 MED ORDER — DULOXETINE HCL 20 MG PO CPEP: 20.0000 mg | ORAL_CAPSULE | Freq: Every day | ORAL | 1 refills | Status: DC

## 2017-07-27 ENCOUNTER — Encounter: Payer: Self-pay | Admitting: Physician Assistant

## 2017-09-27 ENCOUNTER — Encounter: Payer: Self-pay | Admitting: Physician Assistant

## 2017-09-28 ENCOUNTER — Other Ambulatory Visit: Payer: Self-pay | Admitting: Physician Assistant

## 2017-09-28 DIAGNOSIS — F410 Panic disorder [episodic paroxysmal anxiety] without agoraphobia: Secondary | ICD-10-CM

## 2017-09-29 NOTE — Telephone Encounter (Signed)
Refill of Xanax and Inderal  LOV 06/25/17  Xanax LRF 05/11/17   #30   0 refills  Inderal  LRF 04/02/17   #30   1 refill  [id number] - [place], [place] - [address]

## 2017-09-30 ENCOUNTER — Encounter: Payer: Self-pay | Admitting: Physician Assistant

## 2017-09-30 ENCOUNTER — Other Ambulatory Visit: Payer: Self-pay | Admitting: Physician Assistant

## 2017-09-30 DIAGNOSIS — F410 Panic disorder [episodic paroxysmal anxiety] without agoraphobia: Secondary | ICD-10-CM

## 2017-09-30 MED ORDER — ALPRAZOLAM 0.25 MG PO TABS: 0.2500 mg | ORAL_TABLET | Freq: Every day | ORAL | 0 refills | Status: DC | PRN

## 2017-10-29 ENCOUNTER — Ambulatory Visit (INDEPENDENT_AMBULATORY_CARE_PROVIDER_SITE_OTHER): Payer: 59 | Admitting: Physician Assistant

## 2017-10-29 ENCOUNTER — Encounter: Payer: Self-pay | Admitting: Physician Assistant

## 2017-10-29 ENCOUNTER — Other Ambulatory Visit: Payer: Self-pay

## 2017-10-29 VITALS — BP 148/88 | HR 73 | Temp 98.7°F | Ht 65.0 in | Wt 159.8 lb

## 2017-10-29 DIAGNOSIS — Z0000 Encounter for general adult medical examination without abnormal findings: Secondary | ICD-10-CM

## 2017-10-29 DIAGNOSIS — Z30011 Encounter for initial prescription of contraceptive pills: Secondary | ICD-10-CM | POA: Diagnosis not present

## 2017-10-29 DIAGNOSIS — Z1329 Encounter for screening for other suspected endocrine disorder: Secondary | ICD-10-CM | POA: Diagnosis not present

## 2017-10-29 DIAGNOSIS — Z13220 Encounter for screening for lipoid disorders: Secondary | ICD-10-CM | POA: Diagnosis not present

## 2017-10-29 DIAGNOSIS — Z131 Encounter for screening for diabetes mellitus: Secondary | ICD-10-CM

## 2017-10-29 DIAGNOSIS — Z130 Encounter for screening for diseases of the blood and blood-forming organs and certain disorders involving the immune mechanism: Secondary | ICD-10-CM

## 2017-10-29 DIAGNOSIS — Z113 Encounter for screening for infections with a predominantly sexual mode of transmission: Secondary | ICD-10-CM | POA: Diagnosis not present

## 2017-10-29 DIAGNOSIS — Z13228 Encounter for screening for other metabolic disorders: Secondary | ICD-10-CM | POA: Diagnosis not present

## 2017-10-29 LAB — POCT URINE PREGNANCY: Preg Test, Ur: NEGATIVE

## 2017-10-29 MED ORDER — NORGESTIMATE-ETH ESTRADIOL 0.25-35 MG-MCG PO TABS: 1.0000 | ORAL_TABLET | Freq: Every day | ORAL | 4 refills | Status: DC

## 2017-10-29 NOTE — Patient Instructions (Addendum)
[facility] gynecology  [address] [place], [place] [address] Phone number: [phone] Fax Number [phone]  [facility] [phone]   prilosec 20mg  daily for 2 weeks  Food Choices for Gastroesophageal Reflux Disease, Adult When you have gastroesophageal reflux disease (GERD), the foods you eat and your eating habits are very important. Choosing the right foods can help ease your discomfort. What guidelines do I need to follow?  Choose fruits, vegetables, whole grains, and low-fat dairy products.  Choose low-fat meat, fish, and poultry.  Limit fats such as oils, salad dressings, butter, nuts, and avocado.  Keep a food diary. This helps you identify foods that cause symptoms.  Avoid foods that cause symptoms. These may be different for everyone.  Eat small meals often instead of 3 large meals a day.  Eat your meals slowly, in a place where you are relaxed.  Limit fried foods.  Cook foods using methods other than frying.  Avoid drinking alcohol.  Avoid drinking large amounts of liquids with your meals.  Avoid bending over or lying down until 2-3 hours after eating. What foods are not recommended? These are some foods and drinks that may make your symptoms worse: Vegetables Tomatoes. Tomato juice. Tomato and spaghetti sauce. Chili peppers. Onion and garlic. Horseradish. Fruits Oranges, grapefruit, and lemon (fruit and juice). Meats High-fat meats, fish, and poultry. This includes hot dogs, ribs, ham, sausage, salami, and bacon. Dairy Whole milk and chocolate milk. Sour cream. Cream. Butter. Ice cream. Cream cheese. Drinks Coffee and tea. Bubbly (carbonated) drinks or energy drinks. Condiments Hot sauce. Barbecue sauce. Sweets/Desserts Chocolate and cocoa. Donuts. Peppermint and spearmint. Fats and Oils High-fat foods. This includes [place] fries and potato chips. Other Vinegar. Strong spices. This includes black pepper, white pepper, red  pepper, cayenne, curry powder, cloves, ginger, and chili powder. The items listed above may not be a complete list of foods and drinks to avoid. Contact your dietitian for more information. This information is not intended to replace advice given to you by your health care provider. Make sure you discuss any questions you have with your health care provider. Document Released: 01/25/2012 Document Revised: 01/01/2016 Document Reviewed: 05/30/2013 Elsevier Interactive Patient Education  2017 Elsevier Inc.  Ethinyl Estradiol; Norgestimate tablets What is this medicine? ETHINYL ESTRADIOL; NORGESTIMATE (ETH in il es tra DYE ole; nor JES ti mate) is an oral contraceptive. The products combine two types of female hormones, an estrogen and a progestin. They are used to prevent ovulation and pregnancy. Some products are also used to treat acne in females. This medicine may be used for other purposes; ask your health care provider or pharmacist if you have questions. COMMON BRAND NAME(S): Estarylla, MONO-LINYAH, MonoNessa, Norgestimate/Ethinyl Estradiol, Ortho Tri-Cyclen, Ortho Tri-Cyclen Lo, Ortho-Cyclen, Previfem, Sprintec, Tri-Estarylla, TRI-LINYAH, Tri-Lo-Estarylla, Tri-Lo-Marzia, Tri-Lo-Sprintec, Tri-Previfem, Tri-Sprintec, Tri-VyLibra, Trinessa, Rachell Cipro What should I tell my health care provider before I take this medicine? They need to know if you have or ever had any of these conditions: -abnormal vaginal bleeding -blood vessel disease or blood clots -breast, cervical, endometrial, ovarian, liver, or uterine cancer -diabetes -gallbladder disease -heart disease or recent heart attack -high blood pressure -high cholesterol -kidney disease -liver disease -migraine headaches -stroke -systemic lupus erythematosus (SLE) -tobacco smoker -an unusual or allergic reaction to estrogens, progestins, other medicines, foods, dyes, or preservatives -pregnant or trying to get  pregnant -breast-feeding How should I use this medicine? Take this medicine by mouth. To reduce nausea, this medicine may be taken with food.  Follow the directions on the prescription label. Take this medicine at the same time each day and in the order directed on the package. Do not take your medicine more often than directed. Contact your pediatrician regarding the use of this medicine in children. Special care may be needed. This medicine has been used in female children who have started having menstrual periods. A patient package insert for the product will be given with each prescription and refill. Read this sheet carefully each time. The sheet may change frequently. Overdosage: If you think you have taken too much of this medicine contact a poison control center or emergency room at once. NOTE: This medicine is only for you. Do not share this medicine with others. What if I miss a dose? If you miss a dose, refer to the patient information sheet you received with your medicine for direction. If you miss more than one pill, this medicine may not be as effective and you may need to use another form of birth control. What may interact with this medicine? Do not take this medicine with the following medication: -dasabuvir; ombitasvir; paritaprevir; ritonavir -ombitasvir; paritaprevir; ritonavir This medicine may also interact with the following medications: -acetaminophen -antibiotics or medicines for infections, especially rifampin, rifabutin, rifapentine, and griseofulvin, and possibly penicillins or tetracyclines -aprepitant -ascorbic acid (vitamin C) -atorvastatin -barbiturate medicines, such as phenobarbital -bosentan -carbamazepine -caffeine -clofibrate -cyclosporine -dantrolene -doxercalciferol -felbamate -grapefruit juice -hydrocortisone -medicines for anxiety or sleeping problems, such as diazepam or temazepam -medicines for diabetes, including pioglitazone -mineral  oil -modafinil -mycophenolate -nefazodone -oxcarbazepine -phenytoin -prednisolone -ritonavir or other medicines for HIV infection or AIDS -rosuvastatin -selegiline -soy isoflavones supplements -St. John's wort -tamoxifen or raloxifene -theophylline -thyroid hormones -topiramate -warfarin This list may not describe all possible interactions. Give your health care provider a list of all the medicines, herbs, non-prescription drugs, or dietary supplements you use. Also tell them if you smoke, drink alcohol, or use illegal drugs. Some items may interact with your medicine. What should I watch for while using this medicine? Visit your doctor or health care professional for regular checks on your progress. You will need a regular breast and pelvic exam and Pap smear while on this medicine. You should also discuss the need for regular mammograms with your health care professional, and follow his or her guidelines for these tests. This medicine can make your body retain fluid, making your fingers, hands, or ankles swell. Your blood pressure can go up. Contact your doctor or health care professional if you feel you are retaining fluid. Use an additional method of contraception during the first cycle that you take these tablets. If you have any reason to think you are pregnant, stop taking this medicine right away and contact your doctor or health care professional. If you are taking this medicine for hormone related problems, it may take several cycles of use to see improvement in your condition. Do not use this product if you smoke and are over 45 years of age. Smoking increases the risk of getting a blood clot or having a stroke while you are taking birth control pills, especially if you are more than 33 years old. If you are a smoker who is 28 years of age or younger, you are strongly advised not to smoke while taking birth control pills. This medicine can make you more sensitive to the sun. Keep  out of the sun. If you cannot avoid being in the sun, wear protective clothing and use sunscreen. Do not use  sun lamps or tanning beds/booths. If you wear contact lenses and notice visual changes, or if the lenses begin to feel uncomfortable, consult your eye care specialist. In some women, tenderness, swelling, or minor bleeding of the gums may occur. Notify your dentist if this happens. Brushing and flossing your teeth regularly may help limit this. See your dentist regularly and inform your dentist of the medicines you are taking. If you are going to have elective surgery, you may need to stop taking this medicine before the surgery. Consult your health care professional for advice. This medicine does not protect you against HIV infection (AIDS) or any other sexually transmitted diseases. What side effects may I notice from receiving this medicine? Side effects that you should report to your doctor or health care professional as soon as possible: -breast tissue changes or discharge -changes in vaginal bleeding during your period or between your periods -chest pain -coughing up blood -dizziness or fainting spells -headaches or migraines -leg, arm or groin pain -severe or sudden headaches -stomach pain (severe) -sudden shortness of breath -sudden loss of coordination, especially on one side of the body -speech problems -symptoms of vaginal infection like itching, irritation or unusual discharge -tenderness in the upper abdomen -vomiting -weakness or numbness in the arms or legs, especially on one side of the body -yellowing of the eyes or skin Side effects that usually do not require medical attention (report to your doctor or health care professional if they continue or are bothersome): -breakthrough bleeding and spotting that continues beyond the 3 initial cycles of pills -breast tenderness -mood changes, anxiety, depression, frustration, anger, or emotional outbursts -increased  sensitivity to sun or ultraviolet light -nausea -skin rash, acne, or brown spots on the skin -weight gain (slight) This list may not describe all possible side effects. Call your doctor for medical advice about side effects. You may report side effects to FDA at 1-800-FDA-1088. Where should I keep my medicine? Keep out of the reach of children. Store at room temperature between 15 and 30 degrees C (59 and 86 degrees F). Throw away any unused medicine after the expiration date. NOTE: This sheet is a summary. It may not cover all possible information. If you have questions about this medicine, talk to your doctor, pharmacist, or health care provider.  2018 Elsevier/Gold Standard (2016-04-05 08:09:09)   IF you received an x-ray today, you will receive an invoice from [facility]. Please contact [facility] at [phone] with questions or concerns regarding your invoice.   IF you received labwork today, you will receive an invoice from [facility]. Please contact [facility] at [phone] with questions or concerns regarding your invoice.   Our billing staff will not be able to assist you with questions regarding bills from these companies.  You will be contacted with the lab results as soon as they are available. The fastest way to get your results is to activate your My Chart account. Instructions are located on the last page of this paperwork. If you have not heard from us regarding the results in 2 weeks, please contact this office.

## 2017-10-29 NOTE — Progress Notes (Signed)
[facility] [address], [place] [address] [phone]  Date:  10/29/2017   Name:  Desiree SarnaMelisa Kilduff   DOB:  05/18/1986   MRN:  [id number]  PCP:  Patient, No Pcp Per    History of Present Illness:  Desiree Stewart is a 32 y.o. female patient who presents to PCP with  Chief Complaint  Patient presents with  . Annual Exam    having annul physical done today. Wanting referral for GYN for pap smear. Having some mild depression.      Diet is not balanced.  She is not eating breakfast.  Eating fastfoods.  She is not getting vegetables daily.  Water intake has improved some.  She is not drinking a lot of liquids in general.    BM: constipated.  No blood or black stool  URINATION: no hematuria, dysuria, or abnormal frequency  SLEEP: 7-8 hours  SOCIAL ACTIVITY: going to the movies, out to eat.   Exercise: none at this time.  EtOH: occasional. 1 drink every 2 months tobaccor or vaping: none Illicit drug use: none  Sexually active: protected sex.    There are no active problems to display for this patient.   Past Medical History:  Diagnosis Date  . Bacterial vaginosis   . Hypertension     History reviewed. No pertinent surgical history.  Social History   Tobacco Use  . Smoking status: Never Smoker  . Smokeless tobacco: Never Used  Substance Use Topics  . Alcohol use: No  . Drug use: No    Family History  Problem Relation Age of Onset  . Breast cancer Mother   . Hypertension Mother     Allergies  Allergen Reactions  . Nsaids Hives    IBU "sometimes" causing eye itching & swelling    Medication list has been reviewed and updated.  Current Outpatient Medications on File Prior to Visit  Medication Sig Dispense Refill  . ALPRAZolam (XANAX) 0.25 MG tablet Take 1-2 tablets (0.25-0.5 mg total) by mouth daily as needed for anxiety. Take 60 minutes prior to anxiety provoking event. 30 tablet 0  . lidocaine (LIDODERM) 5 % Place 1 patch onto the skin daily.  Remove & Discard patch within 12 hours or as directed by MD 30 patch 1  . propranolol (INDERAL) 40 MG tablet TAKE 1 TABLET BY MOUTH ONCE DAILY AS NEEDED TAKE  60-90  MINUTES  PRIOR  TO  ANXIETY-PROVOKING  EVENT 30 tablet 1  . DULoxetine (CYMBALTA) 20 MG capsule Take 1 capsule (20 mg total) by mouth daily. (Patient not taking: Reported on 10/29/2017) 30 capsule 1  . FLUoxetine (PROZAC) 20 MG tablet Take 1 tablet (20 mg total) by mouth daily. Start with half tablet daily for one week, then increase to full tablet daily. (Patient not taking: Reported on 06/25/2017) 30 tablet 1   No current facility-administered medications on file prior to visit.     ROS ROS otherwise unremarkable unless listed above.  Physical Examination: BP (!) 148/88 (BP Location: Left Arm, Patient Position: Sitting, Cuff Size: Normal)   Pulse 73   Temp 98.7 F (37.1 C) (Oral)   Ht 5\' 5"  (1.651 m)   Wt 159 lb 12.8 oz (72.5 kg)   LMP 10/22/2017   SpO2 100%   BMI 26.59 kg/m  Ideal Body Weight: Weight in (lb) to have BMI = 25: 149.9  Physical Exam  Constitutional: She is oriented to person, place, and time. She appears well-developed and well-nourished. No distress.  HENT:  Head: Normocephalic and atraumatic.  Right Ear: Tympanic membrane, external ear and ear canal normal.  Left Ear: Tympanic membrane, external ear and ear canal normal.  Nose: Right sinus exhibits no maxillary sinus tenderness and no frontal sinus tenderness. Left sinus exhibits no maxillary sinus tenderness and no frontal sinus tenderness.  Mouth/Throat: Oropharynx is clear and moist. No uvula swelling. No oropharyngeal exudate, posterior oropharyngeal edema or posterior oropharyngeal erythema.  Eyes: Pupils are equal, round, and reactive to light. Conjunctivae and EOM are normal.  Neck: Normal range of motion. Neck supple. No thyromegaly present.  Cardiovascular: Normal rate, regular rhythm, normal heart sounds and intact distal pulses. Exam reveals  no gallop, no distant heart sounds and no friction rub.  No murmur heard. Pulmonary/Chest: Effort normal and breath sounds normal. No respiratory distress. She has no decreased breath sounds. She has no wheezes. She has no rhonchi.  Abdominal: Soft. Bowel sounds are normal. She exhibits no distension and no mass. There is no tenderness.  Musculoskeletal: Normal range of motion. She exhibits no edema or tenderness.  Lymphadenopathy:       Head (right side): No submandibular, no tonsillar, no preauricular and no posterior auricular adenopathy present.       Head (left side): No submandibular, no tonsillar, no preauricular and no posterior auricular adenopathy present.    She has no cervical adenopathy.  Neurological: She is alert and oriented to person, place, and time. No cranial nerve deficit. She exhibits normal muscle tone. Coordination normal.  Skin: Skin is warm and dry. She is not diaphoretic.  Psychiatric: She has a normal mood and affect. Her behavior is normal.     Assessment and Plan: [name] is a 32 y.o. female who is here today for cc of  Chief Complaint  Patient presents with  . Annual Exam    having annul physical done today. Wanting referral for GYN for pap smear. Having some mild depression.   -- Annual physical exam - Plan: Basic metabolic panel, CBC, TSH, HIV antibody, RPR, POCT urine pregnancy, CANCELED: Lipid panel, CANCELED: Hemoglobin A1c  Screening for metabolic disorder - Plan: Basic metabolic panel  Screening for deficiency anemia - Plan: CBC  Screening for lipid disorders - Plan: CANCELED: Lipid panel  Screening for thyroid disorder - Plan: TSH  Screening for diabetes mellitus - Plan: CANCELED: Hemoglobin A1c  Screening for STD (sexually transmitted disease) - Plan: HIV antibody, RPR  Encounter for initial prescription of contraceptive pills - Plan: norgestimate-ethinyl estradiol (ORTHO-CYCLEN,SPRINTEC,PREVIFEM) 0.25-35 MG-MCG tablet  [name], PA-C [facility] 3/29/201911:55 AM

## 2017-10-30 LAB — CBC
HEMATOCRIT: 40.3 % (ref 34.0–46.6)
HEMOGLOBIN: 13.4 g/dL (ref 11.1–15.9)
MCH: 28.8 pg (ref 26.6–33.0)
MCHC: 33.3 g/dL (ref 31.5–35.7)
MCV: 87 fL (ref 79–97)
Platelets: 189 10*3/uL (ref 150–379)
RBC: 4.65 x10E6/uL (ref 3.77–5.28)
RDW: 14 % (ref 12.3–15.4)
WBC: 5.4 10*3/uL (ref 3.4–10.8)

## 2017-10-30 LAB — BASIC METABOLIC PANEL
BUN/Creatinine Ratio: 13 (ref 9–23)
BUN: 11 mg/dL (ref 6–20)
CALCIUM: 9.4 mg/dL (ref 8.7–10.2)
CO2: 25 mmol/L (ref 20–29)
Chloride: 104 mmol/L (ref 96–106)
Creatinine, Ser: 0.87 mg/dL (ref 0.57–1.00)
GFR calc Af Amer: 102 mL/min/{1.73_m2} (ref >59)
GFR calc non Af Amer: 88 mL/min/{1.73_m2} (ref >59)
GLUCOSE: 75 mg/dL (ref 65–99)
Potassium: 4.3 mmol/L (ref 3.5–5.2)
Sodium: 143 mmol/L (ref 134–144)

## 2017-10-30 LAB — RPR: RPR Ser Ql: NONREACTIVE

## 2017-10-30 LAB — TSH: TSH: 0.775 u[IU]/mL (ref 0.450–4.500)

## 2017-10-30 LAB — HIV ANTIBODY (ROUTINE TESTING W REFLEX): HIV SCREEN 4TH GENERATION: NONREACTIVE

## 2017-11-03 ENCOUNTER — Encounter: Payer: Self-pay | Admitting: Physician Assistant

## 2017-11-08 NOTE — Telephone Encounter (Signed)
Copied from CRM [id number]. Topic: General - Other >> Nov 08, 2017 12:23 PM Darletta MollLander, Lumin L wrote: Reason for CRM: Patient would like to know if the form she faxed was received by PA English. She faxed on Monday 03/25 and has sent a mychart message asking about it as well.

## 2017-11-09 ENCOUNTER — Telehealth: Payer: Self-pay | Admitting: Physician Assistant

## 2017-11-09 NOTE — Telephone Encounter (Signed)
Copied from CRM [id number]. Topic: Quick Communication - See Telephone Encounter >> Nov 09, 2017  3:58 PM Oneal GroutSebastian, Jennifer S wrote: CRM for notification. See Telephone encounter for: 11/09/17. Would like to speak with CMA regarding paperwork, was not completed correctly. Requesting call back

## 2017-11-10 NOTE — Telephone Encounter (Signed)
Filled out paperwork with height, weight, BP.   If that is all patient needs it is at the front desk waiting for her.

## 2017-11-16 ENCOUNTER — Encounter: Payer: Self-pay | Admitting: Physician Assistant

## 2017-12-12 IMAGING — DX DG THORACIC SPINE 2V
2 series · 2 of 2 positions shown · non-contrast
Comparison: Chest radiograph 05/24/2010

CLINICAL DATA: Worsening back pain

EXAM:
THORACIC SPINE 2 VIEWS

[t-spine ap]
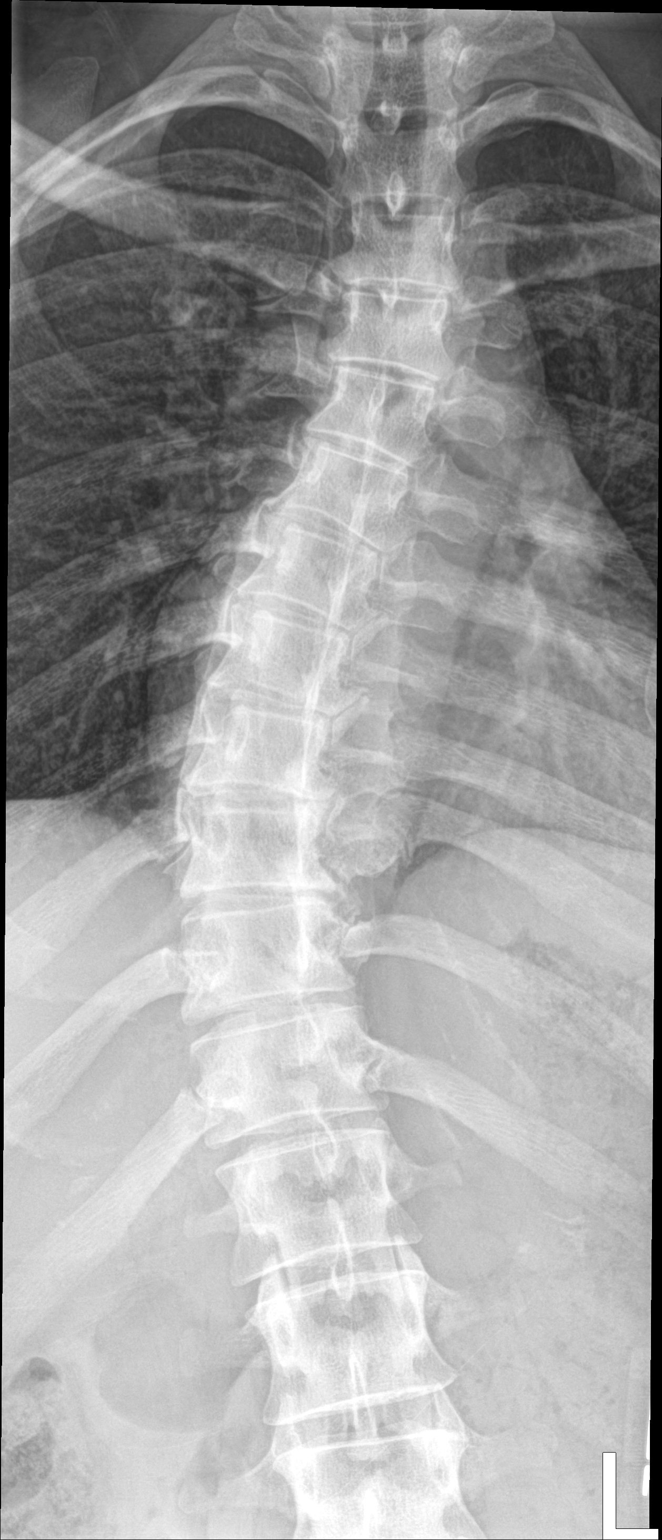

[t-spine lat]
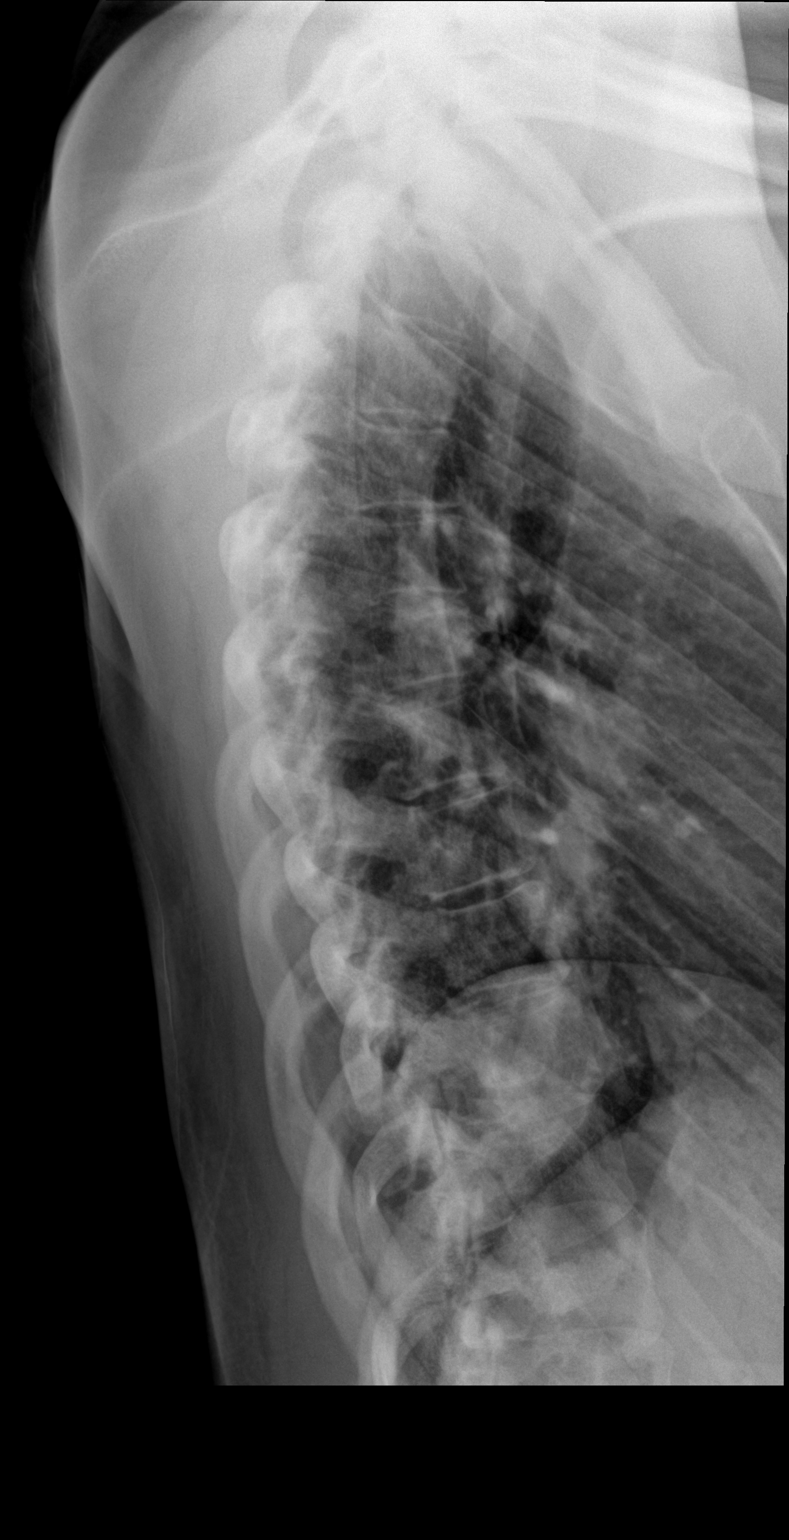

[2 of 2 positions shown; findings below may reference images not displayed]

FINDINGS: There is right convex thoracic scoliosis, unchanged. No acute
fracture. Visualized lungs are clear.
IMPRESSION: Unchanged dextroscoliosis of the thoracic spine.  No acute findings.

## 2018-03-01 ENCOUNTER — Other Ambulatory Visit: Payer: Self-pay | Admitting: General Practice

## 2018-03-01 DIAGNOSIS — F410 Panic disorder [episodic paroxysmal anxiety] without agoraphobia: Secondary | ICD-10-CM

## 2018-03-01 NOTE — Telephone Encounter (Signed)
Copied from CRM [id number]. Topic: Quick Communication - Rx Refill/Question >> Mar 01, 2018 12:58 PM Mcneil, Ja-Kwan wrote: Medication: propranolol (INDERAL) 40 MG tablet  and  ALPRAZolam (XANAX) 0.25 MG tablet  Has the patient contacted their pharmacy? yes  Pt states she was told by the pharmacy that a fax was sent requesting a refill on each Rx  Preferred Pharmacy (with phone number or street name): [facility] [address] - [place], [place] - [address] [phone] (Phone) [phone] (Fax)   Agent: Please be advised that RX refills may take up to 3 business days. We ask that you follow-up with your pharmacy.

## 2018-03-02 NOTE — Telephone Encounter (Signed)
Patient called and asked did she want to stay at [facility] and establish care with another provider, she agrees. She says she will call back when she can find out about her work schedule, because she started a new job and is in the probationary period for her insurance. I advised the refill requests will be sent to Dr. Creta LevinStallings for approval to refill. She says she is out of the medications right now, but is ok until it gets filled.  Xanax and Inderal refill Last OV:10/29/17 Last refill by Trena PlattStephanie English; Xanax 09/30/17 30 tab/0 refill; Inderal 10/04/17 30 tab/1 refill [facility] [address] - [place], [place] - [address] [phone] (Phone) [phone] (Fax)

## 2018-03-06 MED ORDER — PROPRANOLOL HCL 40 MG PO TABS: ORAL_TABLET | ORAL | 1 refills | Status: DC

## 2018-03-06 NOTE — Telephone Encounter (Signed)
Patient is requesting a refill of the following medications: Requested Prescriptions   Pending Prescriptions Disp Refills  . ALPRAZolam (XANAX) 0.25 MG tablet 30 tablet 0    Sig: Take 1-2 tablets (0.25-0.5 mg total) by mouth daily as needed for anxiety. Take 60 minutes prior to anxiety provoking event.   Signed Prescriptions Disp Refills  . propranolol (INDERAL) 40 MG tablet 30 tablet 1    Sig: TAKE 1 TABLET BY MOUTH ONCE DAILY AS NEEDED TAKE  60-90  MINUTES  PRIOR  TO  ANXIETY-PROVOKING  EVENT    Authorizing Provider: Forrest Moron    Ordering User: Leim Fabry    Date of patient request: 03/01/18 Last office visit: 10/29/17 annual exam with English Date of last refill: xanax last filled on 09/30/17 #30 no refills and propanolol lst filled on 10/04/17 #30 tabs with 1 refill Last refill amount: Follow up time period per chart: n/a  Please advise

## 2018-03-06 NOTE — Telephone Encounter (Signed)
Per pt pharmacy has not received refills, please advise.

## 2018-03-23 ENCOUNTER — Telehealth: Payer: Self-pay | Admitting: General Practice

## 2018-03-23 NOTE — Telephone Encounter (Signed)
Called pt. To reschedule visit on 05/03/18. Left VM

## 2018-04-17 MED ORDER — ALPRAZOLAM 0.25 MG PO TABS: 0.2500 mg | ORAL_TABLET | Freq: Every day | ORAL | 0 refills | Status: DC | PRN

## 2018-04-19 ENCOUNTER — Ambulatory Visit: Payer: Self-pay | Admitting: Family Medicine

## 2018-05-03 ENCOUNTER — Ambulatory Visit: Payer: Self-pay | Admitting: Family Medicine

## 2019-10-16 ENCOUNTER — Ambulatory Visit (HOSPITAL_COMMUNITY)
Admission: EM | Admit: 2019-10-16 | Discharge: 2019-10-16 | Disposition: A | Discharge status: Home / Self Care | Payer: Managed Care, Other (non HMO) | Attending: Urgent Care | Admitting: Urgent Care

## 2019-10-16 ENCOUNTER — Other Ambulatory Visit: Payer: Self-pay

## 2019-10-16 ENCOUNTER — Encounter (HOSPITAL_COMMUNITY): Payer: Self-pay

## 2019-10-16 DIAGNOSIS — R102 Pelvic and perineal pain: Secondary | ICD-10-CM | POA: Diagnosis present

## 2019-10-16 DIAGNOSIS — Z3202 Encounter for pregnancy test, result negative: Secondary | ICD-10-CM

## 2019-10-16 DIAGNOSIS — N3000 Acute cystitis without hematuria: Secondary | ICD-10-CM | POA: Insufficient documentation

## 2019-10-16 DIAGNOSIS — I10 Essential (primary) hypertension: Secondary | ICD-10-CM

## 2019-10-16 DIAGNOSIS — R350 Frequency of micturition: Secondary | ICD-10-CM | POA: Diagnosis present

## 2019-10-16 LAB — POC URINE PREG, ED: Preg Test, Ur: NEGATIVE

## 2019-10-16 LAB — POCT PREGNANCY, URINE: Preg Test, Ur: NEGATIVE

## 2019-10-16 MED ORDER — SULFAMETHOXAZOLE-TRIMETHOPRIM 800-160 MG PO TABS: 1.0000 | ORAL_TABLET | Freq: Two times a day (BID) | ORAL | 0 refills | Status: DC

## 2019-10-16 NOTE — ED Triage Notes (Signed)
Pt present with severe lower pelvic pain, urinary frequency and urinary urgency since waking up this morning.

## 2019-10-16 NOTE — ED Provider Notes (Signed)
[employer]   MRN: [id number] DOB: 11-24-1985  Subjective:   Desiree Stewart is a 34 y.o. female presenting for acute onset of lower pelvic pressure/pain, urinary frequency, urinary urgency this morning. Has had left sided low back pain. Does not hydrate well with water. Drinks juices, rarely drinks alcohol. Has a hx of HTN, is not taking her bp medications. Patient states that she normally doesn't have high bp but her pain is not helping.   No current facility-administered medications for this encounter.  Current Outpatient Medications:  .  ALPRAZolam (XANAX) 0.25 MG tablet, Take 1-2 tablets (0.25-0.5 mg total) by mouth daily as needed for anxiety. Take 60 minutes prior to anxiety provoking event. Needs appt, Disp: 10 tablet, Rfl: 0 .  DULoxetine (CYMBALTA) 20 MG capsule, Take 1 capsule (20 mg total) by mouth daily. (Patient not taking: Reported on 10/29/2017), Disp: 30 capsule, Rfl: 1 .  FLUoxetine (PROZAC) 20 MG tablet, Take 1 tablet (20 mg total) by mouth daily. Start with half tablet daily for one week, then increase to full tablet daily. (Patient not taking: Reported on 06/25/2017), Disp: 30 tablet, Rfl: 1 .  lidocaine (LIDODERM) 5 %, Place 1 patch onto the skin daily. Remove & Discard patch within 12 hours or as directed by MD, Disp: 30 patch, Rfl: 1 .  norgestimate-ethinyl estradiol (ORTHO-CYCLEN,SPRINTEC,PREVIFEM) 0.25-35 MG-MCG tablet, Take 1 tablet by mouth daily., Disp: 3 Package, Rfl: 4 .  propranolol (INDERAL) 40 MG tablet, TAKE 1 TABLET BY MOUTH ONCE DAILY AS NEEDED TAKE  60-90  MINUTES  PRIOR  TO  ANXIETY-PROVOKING  EVENT, Disp: 30 tablet, Rfl: 1   Allergies  Allergen Reactions  . Nsaids Hives    IBU "sometimes" causing eye itching & swelling    Past Medical History:  Diagnosis Date  . Bacterial vaginosis   . Hypertension      History reviewed. No pertinent surgical history.  Family History  Problem Relation Age of Onset  . Breast cancer Mother   .  Hypertension Mother     Social History   Tobacco Use  . Smoking status: Never Smoker  . Smokeless tobacco: Never Used  Substance Use Topics  . Alcohol use: No  . Drug use: No    ROS   Objective:   Vitals: BP (!) 162/104 (BP Location: Right Arm)   Pulse 82   Temp 98.3 F (36.8 C) (Oral)   Resp 18   LMP 10/09/2019   SpO2 100%   Physical Exam Constitutional:      General: She is not in acute distress.    Appearance: Normal appearance. She is well-developed and normal weight. She is not ill-appearing, toxic-appearing or diaphoretic.  HENT:     Head: Normocephalic and atraumatic.     Right Ear: External ear normal.     Left Ear: External ear normal.     Nose: Nose normal.     Mouth/Throat:     Mouth: Mucous membranes are moist.     Pharynx: Oropharynx is clear.  Eyes:     General: No scleral icterus.    Extraocular Movements: Extraocular movements intact.     Pupils: Pupils are equal, round, and reactive to light.  Cardiovascular:     Rate and Rhythm: Normal rate and regular rhythm.     Heart sounds: Normal heart sounds. No murmur. No friction rub. No gallop.   Pulmonary:     Effort: Pulmonary effort is normal. No respiratory distress.     Breath sounds: Normal breath  sounds. No stridor. No wheezing, rhonchi or rales.  Abdominal:     General: Bowel sounds are normal. There is no distension.     Palpations: Abdomen is soft. There is no mass.     Tenderness: There is abdominal tenderness (mild, suprapubic). There is no right CVA tenderness, left CVA tenderness, guarding or rebound.  Skin:    General: Skin is warm and dry.     Coloration: Skin is not pale.     Findings: No rash.  Neurological:     General: No focal deficit present.     Mental Status: She is alert and oriented to person, place, and time.  Psychiatric:        Mood and Affect: Mood normal.        Behavior: Behavior normal.        Thought Content: Thought content normal.        Judgment: Judgment  normal.     Urinalysis did not cross over. Report showed large nitrites, large leukocytes, proteinuria. Negative pregnancy test.   Assessment and Plan :   1. Acute cystitis without hematuria   2. Urinary frequency   3. Essential hypertension   4. Pelvic pain     Start Bactrim, urine culture is pending.  No signs of pyelonephritis but given acute onset of her sx emphasized low threshold to rtc.  Use Tylenol for pain.  Recommended that she start hydrating with plain water more consistently. Counseled patient on potential for adverse effects with medications prescribed/recommended today, strict ER and return-to-clinic precautions discussed, patient verbalized understanding.    Wallis Bamberg, PA-C 10/16/19 1156

## 2019-10-18 LAB — URINE CULTURE: Culture: >=100000 — AB

## 2021-06-05 ENCOUNTER — Emergency Department (HOSPITAL_COMMUNITY)
Admission: EM | Admit: 2021-06-05 | Discharge: 2021-06-05 | Discharge status: Against Medical Advice | Payer: Managed Care, Other (non HMO) | Attending: Emergency Medicine | Admitting: Emergency Medicine

## 2021-06-05 ENCOUNTER — Other Ambulatory Visit: Payer: Self-pay

## 2021-06-05 DIAGNOSIS — R531 Weakness: Secondary | ICD-10-CM

## 2021-11-20 ENCOUNTER — Institutional Professional Consult (permissible substitution): Payer: Self-pay | Admitting: Plastic Surgery

## 2021-12-11 ENCOUNTER — Institutional Professional Consult (permissible substitution): Payer: Self-pay | Admitting: Plastic Surgery

## 2021-12-15 ENCOUNTER — Telehealth: Payer: Self-pay | Admitting: Genetic Counselor

## 2021-12-15 NOTE — Telephone Encounter (Signed)
Scheduled appt per 5/2 referral. Pt is aware of appt date and time. Pt is aware to arrive 15 mins prior to appt time and to bring and updated insurance card. Pt is aware of appt location.   ?

## 2022-01-08 ENCOUNTER — Institutional Professional Consult (permissible substitution): Payer: Self-pay | Admitting: Plastic Surgery

## 2022-01-18 ENCOUNTER — Other Ambulatory Visit: Payer: Self-pay

## 2022-01-18 ENCOUNTER — Inpatient Hospital Stay: Payer: BC Managed Care – PPO | Attending: Internal Medicine | Admitting: Genetic Counselor

## 2022-01-18 ENCOUNTER — Encounter: Payer: Self-pay | Admitting: Genetic Counselor

## 2022-01-18 ENCOUNTER — Other Ambulatory Visit: Payer: Self-pay | Admitting: Genetic Counselor

## 2022-01-18 ENCOUNTER — Inpatient Hospital Stay: Payer: BC Managed Care – PPO

## 2022-01-18 DIAGNOSIS — Z803 Family history of malignant neoplasm of breast: Secondary | ICD-10-CM

## 2022-01-18 HISTORY — DX: Family history of malignant neoplasm of breast: Z80.3

## 2022-01-18 NOTE — Addendum Note (Signed)
Addended by: Christiane Ha on: 01/18/2022 09:54 AM   Modules accepted: Orders

## 2022-01-18 NOTE — Progress Notes (Signed)
REFERRING PROVIDER: Janyth Contes, MD [address],  [place] [address]  PRIMARY PROVIDER:  Willeen Niece, PA   PRIMARY REASON FOR VISIT:  Encounter Diagnosis  Name Primary?   Family history of breast cancer      HISTORY OF PRESENT ILLNESS:   Desiree Stewart, a 36 y.o. female, was seen for a [place] cancer genetics consultation at the request of Dr. Sandford Craze due to a family history of breast cancer.  Desiree Stewart presents to clinic today to discuss the possibility of a hereditary predisposition to cancer, to discuss genetic testing, and to further clarify her future cancer risks, as well as potential cancer risks for family members.   Desiree Stewart is a 36 y.o. female with no personal history of cancer.    CANCER HISTORY:  Oncology History   No history exists.     RISK FACTORS:  Mammogram within the last year: no Number of breast biopsies: 0. Colonoscopy: no; not examined. Hysterectomy: no.  Ovaries intact: yes.  Up to date with pelvic exams: yes. Menarche was at age 8.  First live birth at age 21.  Menopausal status: premenopausal.  OCP use for less than 1 year.  HRT use: 0 years. Any excessive radiation exposure in the past: no  Past Medical History:  Diagnosis Date   Bacterial vaginosis    Hypertension     FAMILY HISTORY:  We obtained a detailed, 4-generation family history.  Significant diagnoses are listed below: Family History  Problem Relation Age of Onset   Breast cancer Mother 17   Breast cancer Paternal Grandmother 27 - 27     Desiree Stewart is unaware of previous family history of genetic testing for hereditary cancer risks.  There is no reported Ashkenazi Jewish ancestry. There is no known consanguinity.  GENETIC COUNSELING ASSESSMENT: Desiree Stewart is a 36 y.o. female with a family history of breast cancer which is not highly suggestive of a hereditary cancer syndrome. We, therefore, discussed and recommended the following at  today's visit.   DISCUSSION: We discussed that, in general, most cancer is not inherited in families, but instead is sporadic or familial. Sporadic cancers occur by chance and typically happen at older ages (>50 years) as this type of cancer is caused by genetic changes acquired during an individual's lifetime. Some families have more cancers than would be expected by chance; however, the ages or types of cancer are not consistent with a known genetic mutation or known genetic mutations have been ruled out. This type of familial cancer is thought to be due to a combination of multiple genetic, environmental, hormonal, and lifestyle factors. While this combination of factors likely increases the risk of cancer, the exact source of this risk is not currently identifiable or testable.    We discussed that approximately 5-10% of cancer is hereditary, meaning that it is due to a mutation in a single gene that is passed down from generation to generation in a family. Most hereditary cases of breast cancer are associated with BRCA1/2 mutations. There are other genes that can be associated an increased risk for breast cancer. We discussed that testing can be beneficial for several reasons, including knowing about other cancer risks, identifying potential screening and risk-reduction options that may be appropriate, and to understand if other family members could be at risk for cancer and allow them to undergo genetic testing.  We discussed with Desiree Stewart that the reported family history of breast cancer does not meet insurance  or NCCN criteria for genetic testing and, therefore, is not highly consistent with a familial hereditary cancer syndrome.  We feel she is at low risk to harbor a gene mutation associated with such a condition. Thus, we did not recommend any genetic testing, at this time, and recommended Desiree Stewart continue to follow the cancer screening guidelines given by her primary healthcare provider.  We  discussed with Desiree Stewart that genetic testing is still available if she wishes to proceed at a self-pay price.   Desiree Stewart has been determined to be at high risk for breast cancer.  Her estimated lifetime risk for breast cancer based on Tyrer-Cuzick risk model is 20.8%.  This risk estimate can change over time and may be repeated to reflect new information in her personal or family history in the future.  For females with a greater than 20% lifetime risk of breast cancer, the [employer]  (NCCN) recommends the following:  Clinical encounter every 6-12 months to begin when identified as being at increased risk, but not before age 21  Annual mammograms. Tomosynthesis is recommended starting 10 years earlier than the youngest breast cancer diagnosis in the family or at age 44 (whichever comes first), but not before age 69   Annual breast MRI starting 10 years earlier than the youngest breast cancer diagnosis in the family or at age 22 (whichever comes first), but not before age 41       We discussed that given the above guidelines, she should begin annual mammograms and consider annual breast MRI by age 73.  Desiree Stewart knows to discuss these recommendations with her referring and primary care provider.  Desiree Stewart is not interested in a referral to the high risk breast clinic at this time.  She knows to reach out in the future if she is interested.    PLAN: Desiree Stewart did not wish to pursue genetic testing at today's visit. We remain available to coordinate genetic testing at any time in the future. We, therefore, recommend Desiree Stewart continue to follow the cancer screening guidelines given by her primary healthcare provider and discuss high risk screening at the appropriate time.  Lastly, we encouraged Desiree Stewart to remain in contact with cancer genetics so that we can continuously update the family history and inform her of any changes in cancer genetics and testing that may  be of benefit for this family.   Desiree Stewart questions were answered to her satisfaction today. Our contact information was provided should additional questions or concerns arise. Thank you for the referral and allowing [place] to share in the care of your patient.   Cari M. Joette Stewart, [place], [facility] Genetic Counselor [email or web address] (P) [phone]   The patient was seen for a total of 30 minutes in face-to-face genetic counseling.  The patient was seen alone.  Drs. Lindi Adie and/or Burr Medico were available to discuss this case as needed.  _______________________________________________________________________ For Office Staff:  Number of people involved in session: 1 Was an Intern/ student involved with case: no

## 2022-01-22 ENCOUNTER — Encounter: Payer: Self-pay | Admitting: Plastic Surgery

## 2022-01-22 ENCOUNTER — Ambulatory Visit: Payer: BC Managed Care – PPO | Admitting: Plastic Surgery

## 2022-01-22 VITALS — BP 134/87 | HR 76 | Ht 65.0 in | Wt 180.0 lb

## 2022-01-22 DIAGNOSIS — N62 Hypertrophy of breast: Secondary | ICD-10-CM

## 2022-01-22 DIAGNOSIS — M546 Pain in thoracic spine: Secondary | ICD-10-CM

## 2022-01-22 DIAGNOSIS — M545 Low back pain, unspecified: Secondary | ICD-10-CM

## 2022-01-22 DIAGNOSIS — Z6830 Body mass index (BMI) 30.0-30.9, adult: Secondary | ICD-10-CM

## 2022-01-22 DIAGNOSIS — Z803 Family history of malignant neoplasm of breast: Secondary | ICD-10-CM

## 2022-01-22 DIAGNOSIS — M25519 Pain in unspecified shoulder: Secondary | ICD-10-CM

## 2022-01-22 DIAGNOSIS — M542 Cervicalgia: Secondary | ICD-10-CM | POA: Diagnosis not present

## 2022-01-22 DIAGNOSIS — G8929 Other chronic pain: Secondary | ICD-10-CM

## 2022-01-23 NOTE — Progress Notes (Signed)
Referring Provider Maud Deed, PA [address] [place],  [place] [address]   CC:  Back pain and breat hypertrophy.   Desiree Stewart is an 36 y.o. female.  HPI:   The patient is a 36 y.o. female with a history of mammary hyperplasia for several years.  She has extremely large breasts causing symptoms that include the following: Back pain in the upper and lower back, including neck pain. She pulls or pins her bra straps to provide better lift and relief of the pressure and pain. She notices relief by holding her breast up manually.  Her shoulder straps cause grooves and pain and pressure that requires padding for relief. Pain medication is sometimes required with motrin and tylenol.  Activities that are hindered by enlarged breasts include: exercise and running.  She has tried supportive clothing as well as fitted bras without improvement.     Mammogram history: none due to age.  Family history of breast cancer:  mother.  Tobacco use:  none.   The patient expresses the desire to pursue surgical intervention.  The BMI = 30.  Preoperative bra size = D cup.   Allergies  Allergen Reactions   Nsaids Hives    IBU "sometimes" causing eye itching & swelling    Outpatient Encounter Medications as of 01/22/2022  Medication Sig   propranolol (INDERAL) 40 MG tablet TAKE 1 TABLET BY MOUTH ONCE DAILY AS NEEDED TAKE  60-90  MINUTES  PRIOR  TO  ANXIETY-PROVOKING  EVENT   [DISCONTINUED] ALPRAZolam (XANAX) 0.25 MG tablet Take 1-2 tablets (0.25-0.5 mg total) by mouth daily as needed for anxiety. Take 60 minutes prior to anxiety provoking event. Needs appt   [DISCONTINUED] DULoxetine (CYMBALTA) 20 MG capsule Take 1 capsule (20 mg total) by mouth daily.   [DISCONTINUED] FLUoxetine (PROZAC) 20 MG tablet Take 1 tablet (20 mg total) by mouth daily. Start with half tablet daily for one week, then increase to full tablet daily.   [DISCONTINUED] lidocaine (LIDODERM) 5 % Place 1 patch  onto the skin daily. Remove & Discard patch within 12 hours or as directed by MD   [DISCONTINUED] norgestimate-ethinyl estradiol (ORTHO-CYCLEN,SPRINTEC,PREVIFEM) 0.25-35 MG-MCG tablet Take 1 tablet by mouth daily.   [DISCONTINUED] sulfamethoxazole-trimethoprim (BACTRIM DS) 800-160 MG tablet Take 1 tablet by mouth 2 (two) times daily.   No facility-administered encounter medications on file as of 01/22/2022.     Past Medical History:  Diagnosis Date   Bacterial vaginosis    Family history of breast cancer 01/18/2022   Hypertension     No past surgical history on file.  Family History  Problem Relation Age of Onset   Breast cancer Mother 57   Hypertension Mother    Breast cancer Paternal Grandmother 85 - 72    Social History   Social History Narrative   Not on file     Review of Systems General: Denies fevers, chills, weight loss CV: Denies chest pain, shortness of breath, palpitations   Physical Exam    01/22/2022    8:57 AM 10/16/2019   11:09 AM 10/29/2017    2:22 PM  Vitals with BMI  Height 5\' 5"   5\' 5"   Weight 180 lbs  159 lbs 13 oz  BMI 29.95  26.59  Systolic 134 162  Diastolic 87 104 88  Pulse 76 82 73    General:  No acute distress,  Alert and oriented, Non-Toxic, Normal speech and affect Breast: No easily palpable breast masses on physical  exam, significant breast ptosis and macromastia. Her breasts are extremely large and fairly symmetric with the left a little larger.  She has hyperpigmentation of the inframammary area on both sides.  The sternal to nipple distance on the right is 34 cm and the left is 35 cm.  The IMF distance is 15 cm on the right and 15 cm on the left.  Base width is 19 bilaterally.  Assessment/Plan   The patient has bilateral symptomatic macromastia.  She is a good candidate for a breast reduction.  The patient is planning on doing PT and a medical weight loss program.  She is interested in pursuing surgical treatment.  She has tried  supportive garments and fitted bras with no relief.  The details of breast reduction surgery were discussed.  I explained the procedure in detail along the with the expected scars.  The risks were discussed in detail and include bleeding, infection, damage to surrounding structures, need for additional procedures, nipple loss, change in nipple sensation, persistent pain, contour irregularities and asymmetries.  I explained that breast feeding is often not possible after breast reduction surgery.  We discussed the expected postoperative course with an overall recovery period of about 1 month.  She demonstrated full understanding of all risks.  The patient is interested in pursuing surgical treatment.  The estimated excess breast tissue to be removed at the time of surgery = 700 grams on the left and 700 grams on the right. Janne Napoleon 01/23/2022, 12:11 PM

## 2022-02-05 ENCOUNTER — Ambulatory Visit: Payer: BC Managed Care – PPO | Attending: Plastic Surgery

## 2022-02-05 DIAGNOSIS — M439 Deforming dorsopathy, unspecified: Secondary | ICD-10-CM | POA: Diagnosis present

## 2022-02-05 DIAGNOSIS — G8929 Other chronic pain: Secondary | ICD-10-CM | POA: Diagnosis not present

## 2022-02-05 DIAGNOSIS — M546 Pain in thoracic spine: Secondary | ICD-10-CM | POA: Diagnosis present

## 2022-02-05 DIAGNOSIS — N62 Hypertrophy of breast: Secondary | ICD-10-CM | POA: Insufficient documentation

## 2022-02-05 DIAGNOSIS — M6281 Muscle weakness (generalized): Secondary | ICD-10-CM | POA: Insufficient documentation

## 2022-02-05 DIAGNOSIS — M542 Cervicalgia: Secondary | ICD-10-CM | POA: Insufficient documentation

## 2022-02-05 DIAGNOSIS — M25519 Pain in unspecified shoulder: Secondary | ICD-10-CM | POA: Insufficient documentation

## 2022-02-05 NOTE — Therapy (Signed)
OUTPATIENT PHYSICAL THERAPY CERVICAL EVALUATION   Patient Name: Desiree Stewart MRN: [id number] DOB:April 25, 1986, 36 y.o., female Today's Date: 02/05/2022    Past Medical History:  Diagnosis Date   Bacterial vaginosis    Family history of breast cancer 01/18/2022   Hypertension    No past surgical history on file. Patient Active Problem List   Diagnosis Date Noted   Family history of breast cancer 01/18/2022    PCP: Patient, No Pcp Per  REFERRING PROVIDER: Janne Napoleon, MD    REFERRING DIAG: M54.2,M25.519 (ICD-10-CM) - Neck and shoulder pain M54.6,G89.29 (ICD-10-CM) - Chronic thoracic back pain, unspecified back pain laterality N62 (ICD-10-CM) - Symptomatic mammary hypertrophy   THERAPY DIAG: cervical/thoracic strain   Rationale for Evaluation and Treatment Rehabilitation  ONSET DATE: chronic  SUBJECTIVE:                                                                                                                                                                                                         SUBJECTIVE STATEMENT: Reports years long history of upper back an neck.  Trials of OTC meds, STM with temporary relief   PERTINENT HISTORY:  Desiree Stewart is an 36 y.o. female.  HPI:    The patient is a 36 y.o. female with a history of mammary hyperplasia for several years.  She has extremely large breasts causing symptoms that include the following: Back pain in the upper and lower back, including neck pain. She pulls or pins her bra straps to provide better lift and relief of the pressure and pain. She notices relief by holding her breast up manually.  Her shoulder straps cause grooves and pain and pressure that requires padding for relief. Pain medication is sometimes required with motrin and tylenol.  Activities that are hindered by enlarged breasts include: exercise and running.  She has tried supportive clothing as well as fitted bras without improvement.     Mammogram  history: none due to age.  Family history of breast cancer:  mother.  Tobacco use:  none.   The patient expresses the desire to pursue surgical intervention.  PAIN:  Are you having pain? Yes: NPRS scale: 8/10 Pain location: neck and upper back Pain description: ache Aggravating factors: upright positions Relieving factors: meds, massage  PRECAUTIONS: None  WEIGHT BEARING RESTRICTIONS No  FALLS:  Has patient fallen in last 6 months? No  LIVING ENVIRONMENT: Lives with: lives with their family Lives in: House/apartment Stairs:  yes Has following equipment at home: None  OCCUPATION: Magazine features editor  PLOF: Independent  PATIENT GOALS To  reduce and manage my symptoms   OBJECTIVE:   DIAGNOSTIC FINDINGS:  none  PATIENT SURVEYS:  NDI 18/50   COGNITION: Overall cognitive status: Within functional limits for tasks assessed   SENSATION: Not tested  POSTURE: rounded shoulders, forward head, and increased thoracic kyphosis  PALPATION: N/T   CERVICAL ROM:   Active ROM A/PROM (deg) eval  Flexion 75%  Extension 90%  Right lateral flexion 75%  Left lateral flexion 75%  Right rotation 90%  Left rotation 90%   (Blank rows = not tested)  UPPER EXTREMITY ROM: WNL  Active ROM Right eval Left eval  Shoulder flexion    Shoulder extension    Shoulder abduction    Shoulder adduction    Shoulder extension    Shoulder internal rotation    Shoulder external rotation    Elbow flexion    Elbow extension    Wrist flexion    Wrist extension    Wrist ulnar deviation    Wrist radial deviation    Wrist pronation    Wrist supination     (Blank rows = not tested)  UPPER EXTREMITY MMT:  MMT Right eval Left eval  Shoulder flexion 5 5  Shoulder extension 5 5  Shoulder abduction 5 5  Shoulder adduction    Shoulder extension    Shoulder internal rotation 5 5  Shoulder external rotation 5 5  Middle trapezius 4 4  Lower trapezius 4 4  Elbow flexion 5 5  Elbow extension  5 5  Wrist flexion    Wrist extension    Wrist ulnar deviation    Wrist radial deviation    Wrist pronation    Wrist supination    Grip strength WNL WNL   (Blank rows = not tested)  CERVICAL SPECIAL TESTS:  Not applicable    FUNCTIONAL TESTS:  N/A  TODAY'S TREATMENT:  Eval and HEP   PATIENT EDUCATION:  Education details: Discussed eval findings, rehab rationale and POC and patient is in agreement  Person educated: Patient Education method: Explanation, Demonstration, and Handouts Education comprehension: verbalized understanding, returned demonstration, and needs further education   HOME EXERCISE PROGRAM: Access Code: [email or web address] URL: [email or web address] Date: 02/05/2022 Prepared by: Gustavus Bryant  Exercises - Shoulder External Rotation and Scapular Retraction with Resistance  - 2 x daily - 7 x weekly - 1 sets - 10 reps - Supine Shoulder Horizontal Abduction with Resistance  - 2 x daily - 7 x weekly - 1 sets - 10 reps - Doorway Pec Stretch at 90 Degrees Abduction  - 2 x daily - 7 x weekly - 1 sets - 3 reps - 30s hold  ASSESSMENT:  CLINICAL IMPRESSION: Patient is a 36 y.o. female who was seen today for physical therapy evaluation and treatment for chronic neck and thoracic pain related to soft tissue strain as a result of mammary hypertrophy. Patient demos a mild rounded shoulder and fwd head posture, mild strength deficits in middle and lower trap strength but good UE AROM and strength.   OBJECTIVE IMPAIRMENTS decreased activity tolerance, decreased ROM, decreased strength, postural dysfunction, and pain.   ACTIVITY LIMITATIONS carrying, lifting, and bending  PARTICIPATION LIMITATIONS: meal prep and cleaning  PERSONAL FACTORS Age, Past/current experiences, and Time since onset of injury/illness/exacerbation are also affecting patient's functional outcome.   REHAB POTENTIAL: Good  CLINICAL DECISION MAKING: Stable/uncomplicated  EVALUATION  COMPLEXITY: Low   GOALS: Goals reviewed with patient? Yes  SHORT TERM GOALS: Target date: 02/26/2022   Patient to demonstrate independence in  HEP  Baseline: [id number] Goal status: INITIAL   LONG TERM GOALS: Target date: 03/19/2022  Decrease worst pain to 4/10 Baseline: 8/10 worst pain Goal status: INITIAL  2.  Decrease NDI score to 10/50 Baseline: 18/50 Goal status: INITIAL  3.  4+/5 middle and lower trapezium strength Baseline: 4/5 strength Goal status: INITIAL  4.  Increase cervical flexion and B SB to 90% Baseline: 75% respectively Goal status: INITIAL   PLAN: PT FREQUENCY: 1x/week  PT DURATION: 6 weeks  PLANNED INTERVENTIONS: Therapeutic exercises, Therapeutic activity, Neuromuscular re-education, Balance training, Gait training, Patient/Family education, Joint mobilization, Dry Needling, Spinal mobilization, Manual therapy, and Re-evaluation  PLAN FOR NEXT SESSION: HEP update, postural training, aerobic conditioning, upper/mid back strengthening, cervical mobility   Hildred Laser, PT 02/05/2022, 7:34 AM

## 2022-02-14 NOTE — Therapy (Unsigned)
OUTPATIENT PHYSICAL THERAPY TREATMENT NOTE   Patient Name: Desiree Stewart MRN: [id number] DOB:June 29, 1986, 36 y.o., female Today's Date: 02/14/2022  PCP: Patient, No Pcp Per REFERRING PROVIDER: Janne Napoleon, MD   END OF SESSION:    Past Medical History:  Diagnosis Date   Bacterial vaginosis    Family history of breast cancer 01/18/2022   Hypertension    No past surgical history on file. Patient Active Problem List   Diagnosis Date Noted   Family history of breast cancer 01/18/2022    REFERRING DIAG: M54.2,M25.519 (ICD-10-CM) - Neck and shoulder pain M54.6,G89.29 (ICD-10-CM) - Chronic thoracic back pain, unspecified back pain laterality N62 (ICD-10-CM) - Symptomatic mammary hypertrophy     THERAPY DIAG: cervical/thoracic strain   Rationale for Evaluation and Treatment Rehabilitation  PERTINENT HISTORY: Desiree Stewart is an 36 y.o. female.  HPI:    The patient is a 36 y.o. female with a history of mammary hyperplasia for several years.  She has extremely large breasts causing symptoms that include the following: Back pain in the upper and lower back, including neck pain. She pulls or pins her bra straps to provide better lift and relief of the pressure and pain. She notices relief by holding her breast up manually.  Her shoulder straps cause grooves and pain and pressure that requires padding for relief. Pain medication is sometimes required with motrin and tylenol.  Activities that are hindered by enlarged breasts include: exercise and running.  She has tried supportive clothing as well as fitted bras without improvement.     Mammogram history: none due to age.  Family history of breast cancer:  mother.  Tobacco use:  none.   The patient expresses the desire to pursue surgical intervention.  PRECAUTIONS: none  SUBJECTIVE: No changes to report, exercises help with pain.  PAIN:  Are you having pain? Yes: NPRS scale: 2/10 Pain location: upper back Pain description:  ache Aggravating factors: activity Relieving factors: exercises   OBJECTIVE: (objective measures completed at initial evaluation unless otherwise dated)   DIAGNOSTIC FINDINGS:  none   PATIENT SURVEYS:  NDI 18/50     COGNITION: Overall cognitive status: Within functional limits for tasks assessed     SENSATION: Not tested   POSTURE: rounded shoulders, forward head, and increased thoracic kyphosis   PALPATION: N/T        CERVICAL ROM:    Active ROM A/PROM (deg) eval  Flexion 75%  Extension 90%  Right lateral flexion 75%  Left lateral flexion 75%  Right rotation 90%  Left rotation 90%   (Blank rows = not tested)   UPPER EXTREMITY ROM: WNL   Active ROM Right eval Left eval  Shoulder flexion      Shoulder extension      Shoulder abduction      Shoulder adduction      Shoulder extension      Shoulder internal rotation      Shoulder external rotation      Elbow flexion      Elbow extension      Wrist flexion      Wrist extension      Wrist ulnar deviation      Wrist radial deviation      Wrist pronation      Wrist supination       (Blank rows = not tested)   UPPER EXTREMITY MMT:   MMT Right eval Left eval  Shoulder flexion 5 5  Shoulder extension 5 5  Shoulder abduction 5 5  Shoulder adduction      Shoulder extension      Shoulder internal rotation 5 5  Shoulder external rotation 5 5  Middle trapezius 4 4  Lower trapezius 4 4  Elbow flexion 5 5  Elbow extension 5 5  Wrist flexion      Wrist extension      Wrist ulnar deviation      Wrist radial deviation      Wrist pronation      Wrist supination      Grip strength WNL WNL   (Blank rows = not tested)   CERVICAL SPECIAL TESTS:  Not applicable      FUNCTIONAL TESTS:  N/A   TODAY'S TREATMENT:  [facility] Adult PT Treatment:                                                DATE: 02/15/22 Therapeutic Exercise: UBE L1 3/3  Supine hor abduction YTB Supine shoulder flexion YTB  Standing rows  YTB 15x Standing shoulder extension YTB 15x Upper trap stretch B 30s x2 per side Seated thoracic extension self mob 10x Prone shoulder flexion 15x Prone shoulder extension 15x Prone Ws 15x Prone scaption 15x Wall scapular retraction YTB15x Open book 10x B       PATIENT EDUCATION:  Education details: Discussed eval findings, rehab rationale and POC and patient is in agreement  Person educated: Patient Education method: Explanation, Demonstration, and Handouts Education comprehension: verbalized understanding, returned demonstration, and needs further education     HOME EXERCISE PROGRAM: Access Code: [email or web address] URL: [email or web address] Date: 02/15/2022 Prepared by: Gustavus Bryant  Exercises - Shoulder External Rotation and Scapular Retraction with Resistance  - 2 x daily - 7 x weekly - 1 sets - 10 reps - Supine Shoulder Horizontal Abduction with Resistance  - 2 x daily - 7 x weekly - 1 sets - 10 reps - Doorway Pec Stretch at 90 Degrees Abduction  - 2 x daily - 7 x weekly - 1 sets - 3 reps - 30s hold - Sidelying Open Book Thoracic Lumbar Rotation and Extension  - 2 x daily - 7 x weekly - 1 sets - 10 reps - Seated Thoracic Lumbar Extension  - 2 x daily - 7 x weekly - 1 sets - 10 reps   ASSESSMENT:   CLINICAL IMPRESSION: Today's session introduced postural training exercises, aerobic work, stretching and thoracic self mobilizations.  Added t-band resistance, cued for proper breathing patterns and emphasized good posture.  Limited R rotation due to underlying scoliosis.     OBJECTIVE IMPAIRMENTS decreased activity tolerance, decreased ROM, decreased strength, postural dysfunction, and pain.    ACTIVITY LIMITATIONS carrying, lifting, and bending   PARTICIPATION LIMITATIONS: meal prep and cleaning   PERSONAL FACTORS Age, Past/current experiences, and Time since onset of injury/illness/exacerbation are also affecting patient's functional outcome.    REHAB  POTENTIAL: Good   CLINICAL DECISION MAKING: Stable/uncomplicated   EVALUATION COMPLEXITY: Low     GOALS: Goals reviewed with patient? Yes   SHORT TERM GOALS: Target date: 02/26/2022    Patient to demonstrate independence in HEP  Baseline: [id number] Goal status: INITIAL     LONG TERM GOALS: Target date: 03/19/2022   Decrease worst pain to 4/10 Baseline: 8/10 worst pain Goal status: INITIAL   2.  Decrease NDI score to 10/50 Baseline: 18/50 Goal status: INITIAL  3.  4+/5 middle and lower trapezium strength Baseline: 4/5 strength Goal status: INITIAL   4.  Increase cervical flexion and B SB to 90% Baseline: 75% respectively Goal status: INITIAL     PLAN: PT FREQUENCY: 1x/week   PT DURATION: 6 weeks   PLANNED INTERVENTIONS: Therapeutic exercises, Therapeutic activity, Neuromuscular re-education, Balance training, Gait training, Patient/Family education, Joint mobilization, Dry Needling, Spinal mobilization, Manual therapy, and Re-evaluation   PLAN FOR NEXT SESSION: HEP update, postural training, aerobic conditioning, upper/mid back strengthening, cervical mobility    Hildred Laser, PT 02/14/2022, 4:44 PM

## 2022-02-15 ENCOUNTER — Ambulatory Visit: Payer: BC Managed Care – PPO | Attending: Plastic Surgery

## 2022-02-15 DIAGNOSIS — M546 Pain in thoracic spine: Secondary | ICD-10-CM | POA: Diagnosis not present

## 2022-02-15 DIAGNOSIS — M439 Deforming dorsopathy, unspecified: Secondary | ICD-10-CM | POA: Insufficient documentation

## 2022-02-15 DIAGNOSIS — M6281 Muscle weakness (generalized): Secondary | ICD-10-CM | POA: Diagnosis present

## 2022-02-26 ENCOUNTER — Ambulatory Visit: Payer: BC Managed Care – PPO

## 2022-02-26 DIAGNOSIS — M6281 Muscle weakness (generalized): Secondary | ICD-10-CM

## 2022-02-26 DIAGNOSIS — M546 Pain in thoracic spine: Secondary | ICD-10-CM | POA: Diagnosis not present

## 2022-02-26 DIAGNOSIS — M439 Deforming dorsopathy, unspecified: Secondary | ICD-10-CM

## 2022-02-26 NOTE — Therapy (Signed)
OUTPATIENT PHYSICAL THERAPY TREATMENT NOTE   Patient Name: Desiree Stewart MRN: [id number] DOB:[birth year], 36 y.o., female Today's Date: 02/26/2022  PCP: Patient, No Pcp Per REFERRING PROVIDER: Janne Napoleon, MD   END OF SESSION:   PT End of Session - 02/26/22 0757     Visit Number 3    Number of Visits 6    Date for PT Re-Evaluation 04/02/22    Authorization Type BCBS    PT Start Time 0750    PT Stop Time 0830    PT Time Calculation (min) 40 min    Activity Tolerance Patient tolerated treatment well    Behavior During Therapy [facility] for tasks assessed/performed             Past Medical History:  Diagnosis Date   Bacterial vaginosis    Family history of breast cancer 01/18/2022   Hypertension    History reviewed. No pertinent surgical history. Patient Active Problem List   Diagnosis Date Noted   Family history of breast cancer 01/18/2022    REFERRING DIAG: M54.2,M25.519 (ICD-10-CM) - Neck and shoulder pain M54.6,G89.29 (ICD-10-CM) - Chronic thoracic back pain, unspecified back pain laterality N62 (ICD-10-CM) - Symptomatic mammary hypertrophy     THERAPY DIAG: cervical/thoracic strain   Rationale for Evaluation and Treatment Rehabilitation  PERTINENT HISTORY: Desiree Stewart is an 36 y.o. female.  HPI:    The patient is a 36 y.o. female with a history of mammary hyperplasia for several years.  She has extremely large breasts causing symptoms that include the following: Back pain in the upper and lower back, including neck pain. She pulls or pins her bra straps to provide better lift and relief of the pressure and pain. She notices relief by holding her breast up manually.  Her shoulder straps cause grooves and pain and pressure that requires padding for relief. Pain medication is sometimes required with motrin and tylenol.  Activities that are hindered by enlarged breasts include: exercise and running.  She has tried supportive clothing as well as fitted bras without  improvement.     Mammogram history: none due to age.  Family history of breast cancer:  mother.  Tobacco use:  none.   The patient expresses the desire to pursue surgical intervention.  PRECAUTIONS: none  SUBJECTIVE: No changes to report, exercises help with pain.  PAIN:  Are you having pain? Yes: NPRS scale: 2/10 Pain location: upper back Pain description: ache Aggravating factors: activity Relieving factors: exercises   OBJECTIVE: (objective measures completed at initial evaluation unless otherwise dated)   DIAGNOSTIC FINDINGS:  none   PATIENT SURVEYS:  NDI 18/50     COGNITION: Overall cognitive status: Within functional limits for tasks assessed     SENSATION: Not tested   POSTURE: rounded shoulders, forward head, and increased thoracic kyphosis   PALPATION: N/T        CERVICAL ROM:    Active ROM A/PROM (deg) eval  Flexion 75%  Extension 90%  Right lateral flexion 75%  Left lateral flexion 75%  Right rotation 90%  Left rotation 90%   (Blank rows = not tested)   UPPER EXTREMITY ROM: WNL   Active ROM Right eval Left eval  Shoulder flexion      Shoulder extension      Shoulder abduction      Shoulder adduction      Shoulder extension      Shoulder internal rotation      Shoulder external rotation      Elbow flexion  Elbow extension      Wrist flexion      Wrist extension      Wrist ulnar deviation      Wrist radial deviation      Wrist pronation      Wrist supination       (Blank rows = not tested)   UPPER EXTREMITY MMT:   MMT Right eval Left eval  Shoulder flexion 5 5  Shoulder extension 5 5  Shoulder abduction 5 5  Shoulder adduction      Shoulder extension      Shoulder internal rotation 5 5  Shoulder external rotation 5 5  Middle trapezius 4 4  Lower trapezius 4 4  Elbow flexion 5 5  Elbow extension 5 5  Wrist flexion      Wrist extension      Wrist ulnar deviation      Wrist radial deviation      Wrist pronation       Wrist supination      Grip strength WNL WNL   (Blank rows = not tested)   CERVICAL SPECIAL TESTS:  Not applicable      FUNCTIONAL TESTS:  N/A   TODAY'S TREATMENT:  [facility] Adult PT Treatment:                                                DATE: 02/26/22 Therapeutic Exercise: UBE L1 3/3  Supine hor abduction YTB 20x Supine shoulder flexion YTB 10/10  Standing rows YTB 20x Standing shoulder extension YTB 15x Upper trap stretch B 30s x2 per side(with OP) Levator stretch 30s x2 B(hand on scapula)  Seated thoracic extension self mob 10x Prone shoulder flexion 20x Prone shoulder extension 20x Prone Ws 20x Prone scaption 20x Wall scapular slides YTB 20xx Open book 10x B  [facility] Adult PT Treatment:                                                DATE: 02/15/22 Therapeutic Exercise: UBE L1 3/3  Supine hor abduction YTB Supine shoulder flexion YTB  Standing rows YTB 15x Standing shoulder extension YTB 15x Upper trap stretch B 30s x2 per side Seated thoracic extension self mob 10x Prone shoulder flexion 15x Prone shoulder extension 15x Prone Ws 15x Prone scaption 15x Wall scapular retraction YTB15x Open book 10x B       PATIENT EDUCATION:  Education details: Discussed eval findings, rehab rationale and POC and patient is in agreement  Person educated: Patient Education method: Explanation, Demonstration, and Handouts Education comprehension: verbalized understanding, returned demonstration, and needs further education     HOME EXERCISE PROGRAM: Access Code: [email or web address] URL: [email or web address] Date: 02/26/2022 Prepared by: Gustavus Bryant  Exercises - Shoulder External Rotation and Scapular Retraction with Resistance  - 2 x daily - 7 x weekly - 1 sets - 10 reps - Supine Shoulder Horizontal Abduction with Resistance  - 2 x daily - 7 x weekly - 1 sets - 10 reps - Doorway Pec Stretch at 90 Degrees Abduction  - 2 x daily - 7 x weekly - 1 sets - 3 reps - 30s  hold - Sidelying Open Book Thoracic Lumbar Rotation and Extension  - 2 x daily - 7  x weekly - 1 sets - 10 reps - Seated Thoracic Lumbar Extension  - 2 x daily - 7 x weekly - 1 sets - 10 reps - Seated Levator Scapulae Stretch on Wall  - 2 x daily - 7 x weekly - 1 sets - 3 reps - 30s hold  ASSESSMENT:   CLINICAL IMPRESSION: Continued upper back symptoms, not as intense, exercises help feels relief following stretches.  Added reps as noted      OBJECTIVE IMPAIRMENTS decreased activity tolerance, decreased ROM, decreased strength, postural dysfunction, and pain.    ACTIVITY LIMITATIONS carrying, lifting, and bending   PARTICIPATION LIMITATIONS: meal prep and cleaning   PERSONAL FACTORS Age, Past/current experiences, and Time since onset of injury/illness/exacerbation are also affecting patient's functional outcome.    REHAB POTENTIAL: Good   CLINICAL DECISION MAKING: Stable/uncomplicated   EVALUATION COMPLEXITY: Low     GOALS: Goals reviewed with patient? Yes   SHORT TERM GOALS: Target date: 02/26/2022    Patient to demonstrate independence in HEP  Baseline: [id number] Goal status: INITIAL     LONG TERM GOALS: Target date: 03/19/2022   Decrease worst pain to 4/10 Baseline: 8/10 worst pain Goal status: INITIAL   2.  Decrease NDI score to 10/50 Baseline: 18/50 Goal status: INITIAL   3.  4+/5 middle and lower trapezium strength Baseline: 4/5 strength Goal status: INITIAL   4.  Increase cervical flexion and B SB to 90% Baseline: 75% respectively Goal status: INITIAL     PLAN: PT FREQUENCY: 1x/week   PT DURATION: 6 weeks   PLANNED INTERVENTIONS: Therapeutic exercises, Therapeutic activity, Neuromuscular re-education, Balance training, Gait training, Patient/Family education, Joint mobilization, Dry Needling, Spinal mobilization, Manual therapy, and Re-evaluation   PLAN FOR NEXT SESSION: HEP update, postural training, aerobic conditioning, upper/mid back strengthening,  cervical mobility    Hildred Laser, PT 02/26/2022, 8:29 AM

## 2022-03-02 ENCOUNTER — Ambulatory Visit: Payer: BC Managed Care – PPO

## 2022-03-02 DIAGNOSIS — M546 Pain in thoracic spine: Secondary | ICD-10-CM

## 2022-03-02 DIAGNOSIS — M439 Deforming dorsopathy, unspecified: Secondary | ICD-10-CM

## 2022-03-02 DIAGNOSIS — M6281 Muscle weakness (generalized): Secondary | ICD-10-CM

## 2022-03-02 NOTE — Therapy (Signed)
OUTPATIENT PHYSICAL THERAPY TREATMENT NOTE   Patient Name: Desiree Stewart MRN: [id number] DOB:April 04, 1986, 36 y.o., female Today's Date: 03/02/2022  PCP: Patient, No Pcp Per REFERRING PROVIDER: Janne Napoleon, MD   END OF SESSION:   PT End of Session - 03/02/22 1749     Visit Number 4    Number of Visits 6    Date for PT Re-Evaluation 04/02/22    Authorization Type BCBS    PT Start Time 1750    PT Stop Time 1830    PT Time Calculation (min) 40 min    Activity Tolerance Patient tolerated treatment well    Behavior During Therapy [facility] for tasks assessed/performed             Past Medical History:  Diagnosis Date   Bacterial vaginosis    Family history of breast cancer 01/18/2022   Hypertension    No past surgical history on file. Patient Active Problem List   Diagnosis Date Noted   Family history of breast cancer 01/18/2022    REFERRING DIAG: M54.2,M25.519 (ICD-10-CM) - Neck and shoulder pain M54.6,G89.29 (ICD-10-CM) - Chronic thoracic back pain, unspecified back pain laterality N62 (ICD-10-CM) - Symptomatic mammary hypertrophy     THERAPY DIAG: cervical/thoracic strain   Rationale for Evaluation and Treatment Rehabilitation  PERTINENT HISTORY: Desiree Stewart is an 36 y.o. female.  HPI:    The patient is a 35 y.o. female with a history of mammary hyperplasia for several years.  She has extremely large breasts causing symptoms that include the following: Back pain in the upper and lower back, including neck pain. She pulls or pins her bra straps to provide better lift and relief of the pressure and pain. She notices relief by holding her breast up manually.  Her shoulder straps cause grooves and pain and pressure that requires padding for relief. Pain medication is sometimes required with motrin and tylenol.  Activities that are hindered by enlarged breasts include: exercise and running.  She has tried supportive clothing as well as fitted bras without improvement.      Mammogram history: none due to age.  Family history of breast cancer:  mother.  Tobacco use:  none.   The patient expresses the desire to pursue surgical intervention.  PRECAUTIONS: none  SUBJECTIVE: Elevated symptoms due to cycle, otherwise exercises offer temporary relief.  PAIN:  Are you having pain? Yes: NPRS scale: 2/10 Pain location: upper back Pain description: ache Aggravating factors: activity Relieving factors: exercises   OBJECTIVE: (objective measures completed at initial evaluation unless otherwise dated)   DIAGNOSTIC FINDINGS:  none   PATIENT SURVEYS:  NDI 18/50     COGNITION: Overall cognitive status: Within functional limits for tasks assessed     SENSATION: Not tested   POSTURE: rounded shoulders, forward head, and increased thoracic kyphosis   PALPATION: N/T        CERVICAL ROM:    Active ROM A/PROM (deg) eval  Flexion 75%  Extension 90%  Right lateral flexion 75%  Left lateral flexion 75%  Right rotation 90%  Left rotation 90%   (Blank rows = not tested)   UPPER EXTREMITY ROM: WNL   Active ROM Right eval Left eval  Shoulder flexion      Shoulder extension      Shoulder abduction      Shoulder adduction      Shoulder extension      Shoulder internal rotation      Shoulder external rotation      Elbow  flexion      Elbow extension      Wrist flexion      Wrist extension      Wrist ulnar deviation      Wrist radial deviation      Wrist pronation      Wrist supination       (Blank rows = not tested)   UPPER EXTREMITY MMT:   MMT Right eval Left eval  Shoulder flexion 5 5  Shoulder extension 5 5  Shoulder abduction 5 5  Shoulder adduction      Shoulder extension      Shoulder internal rotation 5 5  Shoulder external rotation 5 5  Middle trapezius 4 4  Lower trapezius 4 4  Elbow flexion 5 5  Elbow extension 5 5  Wrist flexion      Wrist extension      Wrist ulnar deviation      Wrist radial deviation      Wrist  pronation      Wrist supination      Grip strength WNL WNL   (Blank rows = not tested)   CERVICAL SPECIAL TESTS:  Not applicable      FUNCTIONAL TESTS:  N/A   TODAY'S TREATMENT:  [facility] Adult PT Treatment:                                                DATE: 03/02/22 Therapeutic Exercise: UBE L2 3/3  Supine hor abduction YTB 20x Supine shoulder flexion YTB 10/10  Standing rows YTB 20x Standing shoulder extension YTB 20x Upper trap stretch B 30s x2 per side(with OP) Levator stretch 30s x2 B(hand on scapula)  Seated thoracic extension  10x Prone shoulder flexion 20x Prone shoulder extension 20x Prone Ws 20x Prone scaption 20x Wall scapular slides YTB using pink roll 15x Open book 10x B emphasis on breathing patterns Supine chin tucks ober 1/2 roll 2x10 Ball wall flexion w/BUE liftoff 10x  [facility] Adult PT Treatment:                                                DATE: 02/26/22 Therapeutic Exercise: UBE L1 3/3  Supine hor abduction YTB 20x Supine shoulder flexion YTB 10/10  Standing rows YTB 20x Standing shoulder extension YTB 15x Upper trap stretch B 30s x2 per side(with OP) Levator stretch 30s x2 B(hand on scapula)  Seated thoracic extension self mob 10x Prone shoulder flexion 20x Prone shoulder extension 20x Prone Ws 20x Prone scaption 20x Wall scapular slides YTB 20xx Open book 10x B  [facility] Adult PT Treatment:                                                DATE: 02/15/22 Therapeutic Exercise: UBE L1 3/3  Supine hor abduction YTB Supine shoulder flexion YTB  Standing rows YTB 15x Standing shoulder extension YTB 15x Upper trap stretch B 30s x2 per side Seated thoracic extension self mob 10x Prone shoulder flexion 15x Prone shoulder extension 15x Prone Ws 15x Prone scaption 15x Wall scapular retraction YTB15x Open book 10x B  PATIENT EDUCATION:  Education details: Discussed eval findings, rehab rationale and POC and patient is in agreement  Person  educated: Patient Education method: Explanation, Demonstration, and Handouts Education comprehension: verbalized understanding, returned demonstration, and needs further education     HOME EXERCISE PROGRAM: Access Code: [email or web address] URL: [email or web address] Date: 02/26/2022 Prepared by: Gustavus Bryant  Exercises - Shoulder External Rotation and Scapular Retraction with Resistance  - 2 x daily - 7 x weekly - 1 sets - 10 reps - Supine Shoulder Horizontal Abduction with Resistance  - 2 x daily - 7 x weekly - 1 sets - 10 reps - Doorway Pec Stretch at 90 Degrees Abduction  - 2 x daily - 7 x weekly - 1 sets - 3 reps - 30s hold - Sidelying Open Book Thoracic Lumbar Rotation and Extension  - 2 x daily - 7 x weekly - 1 sets - 10 reps - Seated Thoracic Lumbar Extension  - 2 x daily - 7 x weekly - 1 sets - 10 reps - Seated Levator Scapulae Stretch on Wall  - 2 x daily - 7 x weekly - 1 sets - 3 reps - 30s hold  ASSESSMENT:   CLINICAL IMPRESSION: Some increased tightness due to her cycle starting.  Added additional CKC tasks against wall, added resistance to UBE.  No other resistance added due to increased tightness.  Able to complete new tasks w/o any added discomfort.     OBJECTIVE IMPAIRMENTS decreased activity tolerance, decreased ROM, decreased strength, postural dysfunction, and pain.    ACTIVITY LIMITATIONS carrying, lifting, and bending   PARTICIPATION LIMITATIONS: meal prep and cleaning   PERSONAL FACTORS Age, Past/current experiences, and Time since onset of injury/illness/exacerbation are also affecting patient's functional outcome.    REHAB POTENTIAL: Good   CLINICAL DECISION MAKING: Stable/uncomplicated   EVALUATION COMPLEXITY: Low     GOALS: Goals reviewed with patient? Yes   SHORT TERM GOALS: Target date: 02/26/2022    Patient to demonstrate independence in HEP  Baseline: [email or web address] Goal status: INITIAL     LONG TERM GOALS: Target date: 03/19/2022    Decrease worst pain to 4/10 Baseline: 8/10 worst pain Goal status: INITIAL   2.  Decrease NDI score to 10/50 Baseline: 18/50 Goal status: INITIAL   3.  4+/5 middle and lower trapezium strength Baseline: 4/5 strength Goal status: INITIAL   4.  Increase cervical flexion and B SB to 90% Baseline: 75% respectively Goal status: INITIAL     PLAN: PT FREQUENCY: 1x/week   PT DURATION: 6 weeks   PLANNED INTERVENTIONS: Therapeutic exercises, Therapeutic activity, Neuromuscular re-education, Balance training, Gait training, Patient/Family education, Joint mobilization, Dry Needling, Spinal mobilization, Manual therapy, and Re-evaluation   PLAN FOR NEXT SESSION: HEP update, postural training, aerobic conditioning, upper/mid back strengthening, cervical mobility    Hildred Laser, PT 03/02/2022, 6:39 PM

## 2022-03-12 ENCOUNTER — Ambulatory Visit: Payer: BC Managed Care – PPO | Attending: Plastic Surgery

## 2022-03-12 DIAGNOSIS — M546 Pain in thoracic spine: Secondary | ICD-10-CM | POA: Insufficient documentation

## 2022-03-12 DIAGNOSIS — M439 Deforming dorsopathy, unspecified: Secondary | ICD-10-CM | POA: Insufficient documentation

## 2022-03-12 DIAGNOSIS — M6281 Muscle weakness (generalized): Secondary | ICD-10-CM | POA: Insufficient documentation

## 2022-03-12 NOTE — Therapy (Signed)
OUTPATIENT PHYSICAL THERAPY TREATMENT NOTE   Patient Name: Desiree Stewart MRN: [id number] DOB:09-25-1985, 36 y.o., female Today's Date: 03/12/2022  PCP: Patient, No Pcp Per REFERRING PROVIDER: Janne Napoleon, MD   END OF SESSION:   PT End of Session - 03/12/22 0801     Visit Number 5    Number of Visits 6    Date for PT Re-Evaluation 04/02/22    Authorization Type BCBS    PT Start Time 0800    PT Stop Time 0830    PT Time Calculation (min) 30 min    Activity Tolerance Patient tolerated treatment well    Behavior During Therapy [facility] for tasks assessed/performed              Past Medical History:  Diagnosis Date   Bacterial vaginosis    Family history of breast cancer 01/18/2022   Hypertension    History reviewed. No pertinent surgical history. Patient Active Problem List   Diagnosis Date Noted   Family history of breast cancer 01/18/2022    REFERRING DIAG: M54.2,M25.519 (ICD-10-CM) - Neck and shoulder pain M54.6,G89.29 (ICD-10-CM) - Chronic thoracic back pain, unspecified back pain laterality N62 (ICD-10-CM) - Symptomatic mammary hypertrophy     THERAPY DIAG: cervical/thoracic strain   Rationale for Evaluation and Treatment Rehabilitation  PERTINENT HISTORY: Desiree Stewart is an 36 y.o. female.  HPI:    The patient is a 36 y.o. female with a history of mammary hyperplasia for several years.  She has extremely large breasts causing symptoms that include the following: Back pain in the upper and lower back, including neck pain. She pulls or pins her bra straps to provide better lift and relief of the pressure and pain. She notices relief by holding her breast up manually.  Her shoulder straps cause grooves and pain and pressure that requires padding for relief. Pain medication is sometimes required with motrin and tylenol.  Activities that are hindered by enlarged breasts include: exercise and running.  She has tried supportive clothing as well as fitted bras without  improvement.     Mammogram history: none due to age.  Family history of breast cancer:  mother.  Tobacco use:  none.   The patient expresses the desire to pursue surgical intervention.  PRECAUTIONS: none  SUBJECTIVE: Continues with upper trap pain and tightness, able to resolve with stretching but symptoms resume eventually.  Feels weight and compression of bra contribute to symptoms.  PAIN:  Are you having pain? Yes: NPRS scale: 2/10 Pain location: upper back Pain description: ache Aggravating factors: activity Relieving factors: exercises   OBJECTIVE: (objective measures completed at initial evaluation unless otherwise dated)   DIAGNOSTIC FINDINGS:  none   PATIENT SURVEYS:  NDI 18/50     COGNITION: Overall cognitive status: Within functional limits for tasks assessed     SENSATION: Not tested   POSTURE: rounded shoulders, forward head, and increased thoracic kyphosis   PALPATION: N/T        CERVICAL ROM:    Active ROM A/PROM (deg) eval  Flexion 75%  Extension 90%  Right lateral flexion 75%  Left lateral flexion 75%  Right rotation 90%  Left rotation 90%   (Blank rows = not tested)   UPPER EXTREMITY ROM: WNL   Active ROM Right eval Left eval  Shoulder flexion      Shoulder extension      Shoulder abduction      Shoulder adduction      Shoulder extension  Shoulder internal rotation      Shoulder external rotation      Elbow flexion      Elbow extension      Wrist flexion      Wrist extension      Wrist ulnar deviation      Wrist radial deviation      Wrist pronation      Wrist supination       (Blank rows = not tested)   UPPER EXTREMITY MMT:   MMT Right eval Left eval  Shoulder flexion 5 5  Shoulder extension 5 5  Shoulder abduction 5 5  Shoulder adduction      Shoulder extension      Shoulder internal rotation 5 5  Shoulder external rotation 5 5  Middle trapezius 4 4  Lower trapezius 4 4  Elbow flexion 5 5  Elbow extension 5 5   Wrist flexion      Wrist extension      Wrist ulnar deviation      Wrist radial deviation      Wrist pronation      Wrist supination      Grip strength WNL WNL   (Blank rows = not tested)   CERVICAL SPECIAL TESTS:  Not applicable      FUNCTIONAL TESTS:  N/A   TODAY'S TREATMENT:  [facility] Adult PT Treatment:                                                DATE: 03/12/22 Therapeutic Exercise: UBE L2 3/3  Supine flys 1# 10x Supine shoulder flexion 1# 10/10  Upper trap stretch B 30s per side(with OP) Levator stretch 30s (hand on scapula)  Seated thoracic extension 10x Prone shoulder flexion 10x 1# Prone shoulder extension 10x 1# Prone Ws 10x 1# Prone scaption 10x 1# Quadruped thoracic rotation 10/10 Ball wall flexion w/BUE liftoff 10x Ball pushups on wall 10x   [facility] Adult PT Treatment:                                                DATE: 03/02/22 Therapeutic Exercise: UBE L2 3/3  Supine hor abduction YTB 20x Supine shoulder flexion YTB 10/10  Standing rows YTB 20x Standing shoulder extension YTB 20x Upper trap stretch B 30s x2 per side(with OP) Levator stretch 30s x2 B(hand on scapula)  Seated thoracic extension  10x Prone shoulder flexion 20x Prone shoulder extension 20x Prone Ws 20x Prone scaption 20x Wall scapular slides YTB using pink roll 15x Open book 10x B emphasis on breathing patterns Supine chin tucks ober 1/2 roll 2x10 Ball wall flexion w/BUE liftoff 10x  [facility] Adult PT Treatment:                                                DATE: 02/26/22 Therapeutic Exercise: UBE L1 3/3  Supine hor abduction YTB 20x Supine shoulder flexion YTB 10/10  Standing rows YTB 20x Standing shoulder extension YTB 15x Upper trap stretch B 30s x2 per side(with OP) Levator stretch 30s x2 B(hand on scapula)  Seated thoracic extension self  mob 10x Prone shoulder flexion 20x Prone shoulder extension 20x Prone Ws 20x Prone scaption 20x Wall scapular slides YTB 20xx Open book 10x  B  [facility] Adult PT Treatment:                                                DATE: 02/15/22 Therapeutic Exercise: UBE L1 3/3  Supine hor abduction YTB Supine shoulder flexion YTB  Standing rows YTB 15x Standing shoulder extension YTB 15x Upper trap stretch B 30s x2 per side Seated thoracic extension self mob 10x Prone shoulder flexion 15x Prone shoulder extension 15x Prone Ws 15x Prone scaption 15x Wall scapular retraction YTB15x Open book 10x B       PATIENT EDUCATION:  Education details: Discussed eval findings, rehab rationale and POC and patient is in agreement  Person educated: Patient Education method: Explanation, Demonstration, and Handouts Education comprehension: verbalized understanding, returned demonstration, and needs further education     HOME EXERCISE PROGRAM: Access Code: [email or web address] URL: [email or web address] Date: 02/26/2022 Prepared by: Gustavus Bryant  Exercises - Shoulder External Rotation and Scapular Retraction with Resistance  - 2 x daily - 7 x weekly - 1 sets - 10 reps - Supine Shoulder Horizontal Abduction with Resistance  - 2 x daily - 7 x weekly - 1 sets - 10 reps - Doorway Pec Stretch at 90 Degrees Abduction  - 2 x daily - 7 x weekly - 1 sets - 3 reps - 30s hold - Sidelying Open Book Thoracic Lumbar Rotation and Extension  - 2 x daily - 7 x weekly - 1 sets - 10 reps - Seated Thoracic Lumbar Extension  - 2 x daily - 7 x weekly - 1 sets - 10 reps - Seated Levator Scapulae Stretch on Wall  - 2 x daily - 7 x weekly - 1 sets - 3 reps - 30s hold  ASSESSMENT:   CLINICAL IMPRESSION: Varied exercises today adding quadruped position as well as introducing weights to exercises.  Quadruped exercises targeted tight regions and patient will begin those at home    OBJECTIVE IMPAIRMENTS decreased activity tolerance, decreased ROM, decreased strength, postural dysfunction, and pain.    ACTIVITY LIMITATIONS carrying, lifting, and bending    PARTICIPATION LIMITATIONS: meal prep and cleaning   PERSONAL FACTORS Age, Past/current experiences, and Time since onset of injury/illness/exacerbation are also affecting patient's functional outcome.    REHAB POTENTIAL: Good   CLINICAL DECISION MAKING: Stable/uncomplicated   EVALUATION COMPLEXITY: Low     GOALS: Goals reviewed with patient? Yes   SHORT TERM GOALS: Target date: 02/26/2022    Patient to demonstrate independence in HEP  Baseline: [id number] Goal status: INITIAL     LONG TERM GOALS: Target date: 03/19/2022   Decrease worst pain to 4/10 Baseline: 8/10 worst pain Goal status: INITIAL   2.  Decrease NDI score to 10/50 Baseline: 18/50 Goal status: INITIAL   3.  4+/5 middle and lower trapezium strength Baseline: 4/5 strength Goal status: INITIAL   4.  Increase cervical flexion and B SB to 90% Baseline: 75% respectively Goal status: INITIAL     PLAN: PT FREQUENCY: 1x/week   PT DURATION: 6 weeks   PLANNED INTERVENTIONS: Therapeutic exercises, Therapeutic activity, Neuromuscular re-education, Balance training, Gait training, Patient/Family education, Joint mobilization, Dry Needling, Spinal mobilization, Manual therapy, and Re-evaluation   PLAN FOR NEXT SESSION: DC  Hildred Laser, PT 03/12/2022, 8:31 AM

## 2022-03-16 DIAGNOSIS — D259 Leiomyoma of uterus, unspecified: Secondary | ICD-10-CM | POA: Diagnosis not present

## 2022-03-16 DIAGNOSIS — Z3A20 20 weeks gestation of pregnancy: Secondary | ICD-10-CM | POA: Diagnosis not present

## 2022-03-17 ENCOUNTER — Ambulatory Visit: Payer: BC Managed Care – PPO

## 2022-03-17 DIAGNOSIS — M439 Deforming dorsopathy, unspecified: Secondary | ICD-10-CM | POA: Diagnosis not present

## 2022-03-17 DIAGNOSIS — M546 Pain in thoracic spine: Secondary | ICD-10-CM | POA: Diagnosis not present

## 2022-03-17 DIAGNOSIS — M6281 Muscle weakness (generalized): Secondary | ICD-10-CM

## 2022-03-17 NOTE — Therapy (Addendum)
OUTPATIENT PHYSICAL THERAPY TREATMENT NOTE/DC SUMMARY   Patient Name: Desiree Stewart MRN: [id number] DOB:August 21, 1985, 36 y.o., female Today's Date: 03/17/2022  PCP: Patient, No Pcp Per REFERRING PROVIDER: Lennice Sites, MD   END OF SESSION:   PT End of Session - 03/17/22 1751     Visit Number 6    Number of Visits 6    Date for PT Re-Evaluation 04/02/22    Authorization Type BCBS    PT Start Time 1750    PT Stop Time 1830    PT Time Calculation (min) 40 min    Activity Tolerance Patient tolerated treatment well    Behavior During Therapy [facility] for tasks assessed/performed               Past Medical History:  Diagnosis Date   Bacterial vaginosis    Family history of breast cancer 01/18/2022   Hypertension    History reviewed. No pertinent surgical history. Patient Active Problem List   Diagnosis Date Noted   Family history of breast cancer 01/18/2022    REFERRING DIAG: M54.2,M25.519 (ICD-10-CM) - Neck and shoulder pain M54.6,G89.29 (ICD-10-CM) - Chronic thoracic back pain, unspecified back pain laterality N62 (ICD-10-CM) - Symptomatic mammary hypertrophy     THERAPY DIAG: cervical/thoracic strain   Rationale for Evaluation and Treatment Rehabilitation  PERTINENT HISTORY: Desiree Stewart is an 36 y.o. female.  HPI:    The patient is a 36 y.o. female with a history of mammary hyperplasia for several years.  She has extremely large breasts causing symptoms that include the following: Back pain in the upper and lower back, including neck pain. She pulls or pins her bra straps to provide better lift and relief of the pressure and pain. She notices relief by holding her breast up manually.  Her shoulder straps cause grooves and pain and pressure that requires padding for relief. Pain medication is sometimes required with motrin and tylenol.  Activities that are hindered by enlarged breasts include: exercise and running.  She has tried supportive clothing as well as fitted  bras without improvement.     Mammogram history: none due to age.  Family history of breast cancer:  mother.  Tobacco use:  none.   The patient expresses the desire to pursue surgical intervention.  PRECAUTIONS: none  SUBJECTIVE: Pain fluctuates between 4 and 8/10  PAIN:  Are you having pain? Yes: NPRS scale: 2/10 Pain location: upper back Pain description: ache Aggravating factors: activity Relieving factors: exercises   OBJECTIVE: (objective measures completed at initial evaluation unless otherwise dated)   DIAGNOSTIC FINDINGS:  none   PATIENT SURVEYS:  NDI 18/50     COGNITION: Overall cognitive status: Within functional limits for tasks assessed     SENSATION: Not tested   POSTURE: rounded shoulders, forward head, and increased thoracic kyphosis   PALPATION: N/T        CERVICAL ROM:    Active ROM A/PROM (deg) eval  Flexion 75%  Extension 90%  Right lateral flexion 75%  Left lateral flexion 75%  Right rotation 90%  Left rotation 90%   (Blank rows = not tested)   UPPER EXTREMITY ROM: WNL   Active ROM Right eval Left eval  Shoulder flexion      Shoulder extension      Shoulder abduction      Shoulder adduction      Shoulder extension      Shoulder internal rotation      Shoulder external rotation      Elbow flexion  Elbow extension      Wrist flexion      Wrist extension      Wrist ulnar deviation      Wrist radial deviation      Wrist pronation      Wrist supination       (Blank rows = not tested)   UPPER EXTREMITY MMT:   MMT Right eval Left eval  Shoulder flexion 5 5  Shoulder extension 5 5  Shoulder abduction 5 5  Shoulder adduction      Shoulder extension      Shoulder internal rotation 5 5  Shoulder external rotation 5 5  Middle trapezius 4 4  Lower trapezius 4 4  Elbow flexion 5 5  Elbow extension 5 5  Wrist flexion      Wrist extension      Wrist ulnar deviation      Wrist radial deviation      Wrist pronation       Wrist supination      Grip strength WNL WNL   (Blank rows = not tested)   CERVICAL SPECIAL TESTS:  Not applicable      FUNCTIONAL TESTS:  N/A   TODAY'S TREATMENT:  [facility] Adult PT Treatment:                                                DATE: 03/17/22 Therapeutic Exercise: UBE L2 3/3  Supine flys 1# 15x Supine shoulder flexion 1# 15/15  Supine chin tucks over 1/2 roll 3x10 Upper trap stretch B 30s 2x per side(with OP) Levator stretch 30s 2x(hand on scapula)  Prone shoulder flexion 15x 1# Prone shoulder extension 15x 1# Prone Ws 15x 1# Prone scaption 15x 1# Quadruped thoracic rotation 10/10 Ball wall flexion w/BUE liftoff 10x Ball pushups on wall 10x  [facility] Adult PT Treatment:                                                DATE: 03/12/22 Therapeutic Exercise: UBE L2 3/3  Supine flys 1# 10x Supine shoulder flexion 1# 10/10  Upper trap stretch B 30s per side(with OP) Levator stretch 30s (hand on scapula)  Seated thoracic extension 10x Prone shoulder flexion 10x 1# Prone shoulder extension 10x 1# Prone Ws 10x 1# Prone scaption 10x 1# Quadruped thoracic rotation 10/10 Ball wall flexion w/BUE liftoff 10x Ball pushups on wall 10x   [facility] Adult PT Treatment:                                                DATE: 03/02/22 Therapeutic Exercise: UBE L2 3/3  Supine hor abduction YTB 20x Supine shoulder flexion YTB 10/10  Standing rows YTB 20x Standing shoulder extension YTB 20x Upper trap stretch B 30s x2 per side(with OP) Levator stretch 30s x2 B(hand on scapula)  Seated thoracic extension  10x Prone shoulder flexion 20x Prone shoulder extension 20x Prone Ws 20x Prone scaption 20x Wall scapular slides YTB using pink roll 15x Open book 10x B emphasis on breathing patterns Supine chin tucks ober 1/2 roll 2x10 Ball wall flexion w/BUE  liftoff 10x         PATIENT EDUCATION:  Education details: Discussed eval findings, rehab rationale and POC and patient is in agreement   Person educated: Patient Education method: Explanation, Demonstration, and Handouts Education comprehension: verbalized understanding, returned demonstration, and needs further education     HOME EXERCISE PROGRAM: Access Code: [email or web address] URL: [email or web address] Date: 02/26/2022 Prepared by: Sharlynn Oliphant  Exercises - Shoulder External Rotation and Scapular Retraction with Resistance  - 2 x daily - 7 x weekly - 1 sets - 10 reps - Supine Shoulder Horizontal Abduction with Resistance  - 2 x daily - 7 x weekly - 1 sets - 10 reps - Doorway Pec Stretch at 90 Degrees Abduction  - 2 x daily - 7 x weekly - 1 sets - 3 reps - 30s hold - Sidelying Open Book Thoracic Lumbar Rotation and Extension  - 2 x daily - 7 x weekly - 1 sets - 10 reps - Seated Thoracic Lumbar Extension  - 2 x daily - 7 x weekly - 1 sets - 10 reps - Seated Levator Scapulae Stretch on Wall  - 2 x daily - 7 x weekly - 1 sets - 3 reps - 30s hold  ASSESSMENT:   CLINICAL IMPRESSION: Rehab goals met or maximum benefit reached   OBJECTIVE IMPAIRMENTS decreased activity tolerance, decreased ROM, decreased strength, postural dysfunction, and pain.    ACTIVITY LIMITATIONS carrying, lifting, and bending   PARTICIPATION LIMITATIONS: meal prep and cleaning   PERSONAL FACTORS Age, Past/current experiences, and Time since onset of injury/illness/exacerbation are also affecting patient's functional outcome.    REHAB POTENTIAL: Good   CLINICAL DECISION MAKING: Stable/uncomplicated   EVALUATION COMPLEXITY: Low     GOALS: Goals reviewed with patient? Yes   SHORT TERM GOALS: Target date: 02/26/2022    Patient to demonstrate independence in HEP  Baseline: [email or web address] Goal status: Met     LONG TERM GOALS: Target date: 03/19/2022   Decrease worst pain to 4/10 Baseline: 8/10 worst pain; 09/11/55 5/05 pain on certain days Goal status: Met   2.  Decrease NDI score to 10/50 Baseline: 18/50; 03/17/22 12/50 Goal status:  Considered met   3.  4+/5 middle and lower trapezium strength Baseline: 4/5 strength; 03/17/22 4+/5 strength observed Goal status: Met   4.  Increase cervical flexion and B SB to 90% Baseline: 75% respectively; 03/17/22 90% B rotation and SB Goal status: Met     PLAN: PT FREQUENCY: 1x/week   PT DURATION: 6 weeks   PLANNED INTERVENTIONS: Therapeutic exercises, Therapeutic activity, Neuromuscular re-education, Balance training, Gait training, Patient/Family education, Joint mobilization, Dry Needling, Spinal mobilization, Manual therapy, and Re-evaluation   PLAN FOR NEXT SESSION: DC    Lanice Shirts, PT 03/17/2022, 6:29 PM

## 2022-03-27 ENCOUNTER — Ambulatory Visit (HOSPITAL_BASED_OUTPATIENT_CLINIC_OR_DEPARTMENT_OTHER)
Admission: RE | Admit: 2022-03-27 | Discharge: 2022-03-27 | Disposition: A | Discharge status: Home / Self Care | Payer: BC Managed Care – PPO | Source: Ambulatory Visit | Attending: Plastic Surgery | Admitting: Plastic Surgery

## 2022-03-27 DIAGNOSIS — N62 Hypertrophy of breast: Secondary | ICD-10-CM | POA: Insufficient documentation

## 2022-03-27 DIAGNOSIS — Z1231 Encounter for screening mammogram for malignant neoplasm of breast: Secondary | ICD-10-CM | POA: Insufficient documentation

## 2022-03-30 ENCOUNTER — Other Ambulatory Visit: Payer: Self-pay | Admitting: Plastic Surgery

## 2022-03-30 DIAGNOSIS — R928 Other abnormal and inconclusive findings on diagnostic imaging of breast: Secondary | ICD-10-CM

## 2022-04-09 DIAGNOSIS — E669 Obesity, unspecified: Secondary | ICD-10-CM | POA: Diagnosis not present

## 2022-04-09 DIAGNOSIS — O09522 Supervision of elderly multigravida, second trimester: Secondary | ICD-10-CM | POA: Diagnosis not present

## 2022-04-09 DIAGNOSIS — O99212 Obesity complicating pregnancy, second trimester: Secondary | ICD-10-CM | POA: Diagnosis not present

## 2022-04-09 DIAGNOSIS — Z363 Encounter for antenatal screening for malformations: Secondary | ICD-10-CM | POA: Diagnosis not present

## 2022-04-13 DIAGNOSIS — B2790 Infectious mononucleosis, unspecified without complication: Secondary | ICD-10-CM | POA: Diagnosis not present

## 2022-04-14 ENCOUNTER — Ambulatory Visit: Payer: BC Managed Care – PPO

## 2022-04-14 ENCOUNTER — Ambulatory Visit
Admission: RE | Admit: 2022-04-14 | Discharge: 2022-04-14 | Disposition: A | Discharge status: Home / Self Care | Payer: BC Managed Care – PPO | Source: Ambulatory Visit | Attending: Plastic Surgery | Admitting: Plastic Surgery

## 2022-04-14 DIAGNOSIS — N6489 Other specified disorders of breast: Secondary | ICD-10-CM | POA: Diagnosis not present

## 2022-04-14 DIAGNOSIS — R922 Inconclusive mammogram: Secondary | ICD-10-CM | POA: Diagnosis not present

## 2022-04-14 DIAGNOSIS — Z803 Family history of malignant neoplasm of breast: Secondary | ICD-10-CM | POA: Diagnosis not present

## 2022-04-14 DIAGNOSIS — R928 Other abnormal and inconclusive findings on diagnostic imaging of breast: Secondary | ICD-10-CM

## 2022-04-24 DIAGNOSIS — D259 Leiomyoma of uterus, unspecified: Secondary | ICD-10-CM | POA: Diagnosis not present

## 2022-04-24 DIAGNOSIS — Z3A25 25 weeks gestation of pregnancy: Secondary | ICD-10-CM | POA: Diagnosis not present

## 2022-04-24 DIAGNOSIS — R109 Unspecified abdominal pain: Secondary | ICD-10-CM | POA: Diagnosis not present

## 2022-04-24 DIAGNOSIS — R000 Tachycardia, unspecified: Secondary | ICD-10-CM | POA: Diagnosis not present

## 2022-04-24 DIAGNOSIS — O09522 Supervision of elderly multigravida, second trimester: Secondary | ICD-10-CM | POA: Diagnosis not present

## 2022-04-24 DIAGNOSIS — O26892 Other specified pregnancy related conditions, second trimester: Secondary | ICD-10-CM | POA: Diagnosis not present

## 2022-04-24 DIAGNOSIS — O10912 Unspecified pre-existing hypertension complicating pregnancy, second trimester: Secondary | ICD-10-CM | POA: Diagnosis not present

## 2022-04-24 DIAGNOSIS — O3412 Maternal care for benign tumor of corpus uteri, second trimester: Secondary | ICD-10-CM | POA: Diagnosis not present

## 2022-04-30 ENCOUNTER — Ambulatory Visit: Payer: BC Managed Care – PPO | Admitting: Plastic Surgery

## 2022-05-07 ENCOUNTER — Encounter: Payer: Self-pay | Admitting: Surgical

## 2022-05-07 ENCOUNTER — Ambulatory Visit: Payer: BC Managed Care – PPO | Admitting: Surgical

## 2022-05-07 VITALS — Wt 175.4 lb

## 2022-05-07 DIAGNOSIS — G8929 Other chronic pain: Secondary | ICD-10-CM

## 2022-05-07 DIAGNOSIS — M546 Pain in thoracic spine: Secondary | ICD-10-CM | POA: Diagnosis not present

## 2022-05-07 DIAGNOSIS — O3412 Maternal care for benign tumor of corpus uteri, second trimester: Secondary | ICD-10-CM | POA: Diagnosis not present

## 2022-05-07 DIAGNOSIS — D259 Leiomyoma of uterus, unspecified: Secondary | ICD-10-CM | POA: Diagnosis not present

## 2022-05-07 DIAGNOSIS — O09522 Supervision of elderly multigravida, second trimester: Secondary | ICD-10-CM | POA: Diagnosis not present

## 2022-05-07 DIAGNOSIS — M542 Cervicalgia: Secondary | ICD-10-CM

## 2022-05-07 DIAGNOSIS — M25512 Pain in left shoulder: Secondary | ICD-10-CM

## 2022-05-07 DIAGNOSIS — N62 Hypertrophy of breast: Secondary | ICD-10-CM | POA: Diagnosis not present

## 2022-05-07 DIAGNOSIS — Z362 Encounter for other antenatal screening follow-up: Secondary | ICD-10-CM | POA: Diagnosis not present

## 2022-05-07 DIAGNOSIS — M25511 Pain in right shoulder: Secondary | ICD-10-CM | POA: Diagnosis not present

## 2022-05-07 DIAGNOSIS — O10919 Unspecified pre-existing hypertension complicating pregnancy, unspecified trimester: Secondary | ICD-10-CM | POA: Diagnosis not present

## 2022-05-07 DIAGNOSIS — M25519 Pain in unspecified shoulder: Secondary | ICD-10-CM

## 2022-05-07 DIAGNOSIS — O10012 Pre-existing essential hypertension complicating pregnancy, second trimester: Secondary | ICD-10-CM | POA: Diagnosis not present

## 2022-05-07 NOTE — Progress Notes (Signed)
   Referring Provider No referring provider defined for this encounter.   CC:  Chief Complaint  Patient presents with   Follow-up      Desiree Stewart is an 36 y.o. female.  HPI: Patient is a 36 y.o. year old female here for follow up after completing physical therapy for pain related to macromastia. She reports that overall physical therapy went well, however she reports that it only provided relief while doing the treatments, immediately after treatment she continued to have neck, shoulder and upper back pain.  She reports that her upper neck, back/shoulders feel "cramped and contracted".  She has lost some weight since her last appointment in June, she is 175.4 pounds today and was 180 pounds a few months ago.  Patient has completed 6 physical therapy visits with the initial eval on 02/05/2022 and her last visit on 03/17/2022.  Review of Systems General: Positive neck, back and shoulder pain.  Physical Exam    05/07/2022    8:35 AM 01/22/2022    8:57 AM 10/16/2019   11:09 AM  Vitals with BMI  Height  5\' 5"    Weight 175 lbs 6 oz 180 lbs   BMI  61.22   Systolic  449 753  Diastolic  87 005  Pulse  76 82    General:  No acute distress,  Alert and oriented, Non-Toxic, Normal speech and affect Psych: Normal behavior and mood    Assessment/Plan Patient is interested in pursuing surgical intervention for bilateral breast reduction. Patient has completed at least 6 weeks of physical therapy for pain related to macromastia.  Discussed with patient we would submit to insurance for authorization, discussed approval could take up to 6 weeks.   I did discuss with the patient that Dr. Erin Hearing will be moving early November, we will set her up with a consult with Dr. Lovena Le for evaluation and to discuss breast reduction.  In the meantime we will submit to insurance, all of her questions were answered to her content.  Carola Rhine Zaide Kardell 05/07/2022, 8:50 AM

## 2022-05-13 DIAGNOSIS — Z369 Encounter for antenatal screening, unspecified: Secondary | ICD-10-CM | POA: Diagnosis not present

## 2022-06-04 ENCOUNTER — Encounter: Payer: Self-pay | Admitting: Plastic Surgery

## 2022-06-04 ENCOUNTER — Ambulatory Visit (INDEPENDENT_AMBULATORY_CARE_PROVIDER_SITE_OTHER): Payer: BC Managed Care – PPO | Admitting: Plastic Surgery

## 2022-06-04 VITALS — BP 139/77 | HR 77 | Ht 65.0 in | Wt 179.0 lb

## 2022-06-04 DIAGNOSIS — N62 Hypertrophy of breast: Secondary | ICD-10-CM | POA: Diagnosis not present

## 2022-06-04 NOTE — Progress Notes (Signed)
   Referring Provider Willeen Niece, [address],  [place] [address]   CC:  Chief Complaint  Patient presents with   Advice Only      Dalal Mcclees is an 36 y.o. female.  HPI: Ms. Dell is a 36 year old female who was previously seen in our clinic for a bilateral breast reduction.  Due to her prior surgeon leaving the practice she was referred to me.  I met with her this morning to discuss the procedure.  Allergies  Allergen Reactions   Nsaids Hives    IBU "sometimes" causing eye itching & swelling    Outpatient Encounter Medications as of 06/04/2022  Medication Sig   propranolol (INDERAL) 40 MG tablet TAKE 1 TABLET BY MOUTH ONCE DAILY AS NEEDED TAKE  60-90  MINUTES  PRIOR  TO  ANXIETY-PROVOKING  EVENT   No facility-administered encounter medications on file as of 06/04/2022.     Past Medical History:  Diagnosis Date   Bacterial vaginosis    Family history of breast cancer 01/18/2022   Hypertension     No past surgical history on file.  Family History  Problem Relation Age of Onset   Breast cancer Mother 2   Hypertension Mother    Breast cancer Paternal Grandmother 56 - 70    Social History   Social History Narrative   Not on file     Review of Systems General: Denies fevers, chills, weight loss CV: Denies chest pain, shortness of breath, palpitations Breast: The patient is finished PT however she complains of persistent upper back and neck pain due to the large size of her breast.  She denies any other issues such as nipple discharge or masses on self-exam.  Physical Exam    06/04/2022    8:23 AM 05/07/2022    8:35 AM 01/22/2022    8:57 AM  Vitals with BMI  Height _0   _1   Weight 179 lbs 175 lbs 6 oz 180 lbs  BMI 29.92  42.68  Systolic 341  962  Diastolic 77  87  Pulse 77  76    General:  No acute distress,  Alert and oriented, Non-Toxic, Normal speech and affect Breast: Patient has bilateral dense heavy  breasts.  She has grade 3 ptosis.  On physical exam there are no masses no nipple retraction no nipple discharge.  The patient has had measurements and photos taken in prior appointments. Mammography: The patient had a mammogram in September which was read as BI-RADS 1  Assessment/Plan Macromastia: Patient has symptomatic macromastia and would benefit from a bilateral breast reduction.  We discussed the location of the incisions and the scarring.  Pacifically we discussed the retention of some of the areolar skin on the vertical incision.  We also discussed the use of drains compression and physical activity after her procedure.  She is encouraged to start early ambulation postoperatively.  She will avoid vigorous activity and heavy lifting defined as greater than 20 pounds for 6 weeks postoperatively.  If she travels within 2 months of the surgery she was given instructions for hydration and alcohol avoidance and physical activity during her flight to avoid DVT formation.  All questions were answered to her satisfaction and she request we proceed with surgery.  Camillia Herter 06/04/2022, 9:00 AM

## 2022-06-08 DIAGNOSIS — Z23 Encounter for immunization: Secondary | ICD-10-CM | POA: Diagnosis not present

## 2022-06-15 DIAGNOSIS — Z3A31 31 weeks gestation of pregnancy: Secondary | ICD-10-CM | POA: Diagnosis not present

## 2022-06-15 DIAGNOSIS — O169 Unspecified maternal hypertension, unspecified trimester: Secondary | ICD-10-CM | POA: Diagnosis not present

## 2022-06-17 DIAGNOSIS — O169 Unspecified maternal hypertension, unspecified trimester: Secondary | ICD-10-CM | POA: Diagnosis not present

## 2022-06-17 DIAGNOSIS — Z3A32 32 weeks gestation of pregnancy: Secondary | ICD-10-CM | POA: Diagnosis not present

## 2022-06-18 DIAGNOSIS — D259 Leiomyoma of uterus, unspecified: Secondary | ICD-10-CM | POA: Diagnosis not present

## 2022-06-18 DIAGNOSIS — O09293 Supervision of pregnancy with other poor reproductive or obstetric history, third trimester: Secondary | ICD-10-CM | POA: Diagnosis not present

## 2022-06-18 DIAGNOSIS — O99212 Obesity complicating pregnancy, second trimester: Secondary | ICD-10-CM | POA: Diagnosis not present

## 2022-06-18 DIAGNOSIS — Z3A32 32 weeks gestation of pregnancy: Secondary | ICD-10-CM | POA: Diagnosis not present

## 2022-06-18 DIAGNOSIS — O09522 Supervision of elderly multigravida, second trimester: Secondary | ICD-10-CM | POA: Diagnosis not present

## 2022-06-18 DIAGNOSIS — O10913 Unspecified pre-existing hypertension complicating pregnancy, third trimester: Secondary | ICD-10-CM | POA: Diagnosis not present

## 2022-06-18 DIAGNOSIS — Z87891 Personal history of nicotine dependence: Secondary | ICD-10-CM | POA: Diagnosis not present

## 2022-06-18 DIAGNOSIS — O09523 Supervision of elderly multigravida, third trimester: Secondary | ICD-10-CM | POA: Diagnosis not present

## 2022-06-18 DIAGNOSIS — O3413 Maternal care for benign tumor of corpus uteri, third trimester: Secondary | ICD-10-CM | POA: Diagnosis not present

## 2022-06-18 DIAGNOSIS — Z8249 Family history of ischemic heart disease and other diseases of the circulatory system: Secondary | ICD-10-CM | POA: Diagnosis not present

## 2022-06-18 DIAGNOSIS — O10013 Pre-existing essential hypertension complicating pregnancy, third trimester: Secondary | ICD-10-CM | POA: Diagnosis not present

## 2022-06-18 DIAGNOSIS — O10912 Unspecified pre-existing hypertension complicating pregnancy, second trimester: Secondary | ICD-10-CM | POA: Diagnosis not present

## 2022-06-22 ENCOUNTER — Telehealth: Payer: Self-pay | Admitting: *Deleted

## 2022-06-22 NOTE — Telephone Encounter (Signed)
Authorization request has been submitted WGY:KZLD [place] CPT - Units: 19318 - 2  Pend [id number]  Patient notified via Mychart of submission

## 2022-06-24 DIAGNOSIS — I87309 Chronic venous hypertension (idiopathic) without complications of unspecified lower extremity: Secondary | ICD-10-CM | POA: Diagnosis not present

## 2022-06-25 DIAGNOSIS — O10913 Unspecified pre-existing hypertension complicating pregnancy, third trimester: Secondary | ICD-10-CM | POA: Diagnosis not present

## 2022-06-25 DIAGNOSIS — E669 Obesity, unspecified: Secondary | ICD-10-CM | POA: Diagnosis not present

## 2022-06-25 DIAGNOSIS — D259 Leiomyoma of uterus, unspecified: Secondary | ICD-10-CM | POA: Diagnosis not present

## 2022-06-25 DIAGNOSIS — O09523 Supervision of elderly multigravida, third trimester: Secondary | ICD-10-CM | POA: Diagnosis not present

## 2022-06-25 DIAGNOSIS — O10013 Pre-existing essential hypertension complicating pregnancy, third trimester: Secondary | ICD-10-CM | POA: Diagnosis not present

## 2022-06-25 DIAGNOSIS — O99213 Obesity complicating pregnancy, third trimester: Secondary | ICD-10-CM | POA: Diagnosis not present

## 2022-06-29 DIAGNOSIS — O10919 Unspecified pre-existing hypertension complicating pregnancy, unspecified trimester: Secondary | ICD-10-CM | POA: Diagnosis not present

## 2022-06-29 DIAGNOSIS — Z3A33 33 weeks gestation of pregnancy: Secondary | ICD-10-CM | POA: Diagnosis not present

## 2022-06-30 DIAGNOSIS — O1490 Unspecified pre-eclampsia, unspecified trimester: Secondary | ICD-10-CM | POA: Diagnosis not present

## 2022-06-30 DIAGNOSIS — O99213 Obesity complicating pregnancy, third trimester: Secondary | ICD-10-CM | POA: Diagnosis not present

## 2022-06-30 DIAGNOSIS — O09523 Supervision of elderly multigravida, third trimester: Secondary | ICD-10-CM | POA: Diagnosis not present

## 2022-06-30 DIAGNOSIS — O10013 Pre-existing essential hypertension complicating pregnancy, third trimester: Secondary | ICD-10-CM | POA: Diagnosis not present

## 2022-06-30 DIAGNOSIS — E669 Obesity, unspecified: Secondary | ICD-10-CM | POA: Diagnosis not present

## 2022-06-30 DIAGNOSIS — O1413 Severe pre-eclampsia, third trimester: Secondary | ICD-10-CM | POA: Diagnosis not present

## 2022-06-30 DIAGNOSIS — I10 Essential (primary) hypertension: Secondary | ICD-10-CM | POA: Diagnosis not present

## 2022-06-30 DIAGNOSIS — O1493 Unspecified pre-eclampsia, third trimester: Secondary | ICD-10-CM | POA: Diagnosis not present

## 2022-06-30 DIAGNOSIS — Z3A33 33 weeks gestation of pregnancy: Secondary | ICD-10-CM | POA: Diagnosis not present

## 2022-07-02 DIAGNOSIS — O34211 Maternal care for low transverse scar from previous cesarean delivery: Secondary | ICD-10-CM | POA: Diagnosis not present

## 2022-07-02 DIAGNOSIS — O09513 Supervision of elderly primigravida, third trimester: Secondary | ICD-10-CM | POA: Diagnosis not present

## 2022-07-02 DIAGNOSIS — Z3A34 34 weeks gestation of pregnancy: Secondary | ICD-10-CM | POA: Diagnosis not present

## 2022-07-02 DIAGNOSIS — O09523 Supervision of elderly multigravida, third trimester: Secondary | ICD-10-CM | POA: Diagnosis not present

## 2022-07-02 DIAGNOSIS — D259 Leiomyoma of uterus, unspecified: Secondary | ICD-10-CM | POA: Diagnosis not present

## 2022-07-02 DIAGNOSIS — O114 Pre-existing hypertension with pre-eclampsia, complicating childbirth: Secondary | ICD-10-CM | POA: Diagnosis not present

## 2022-07-02 DIAGNOSIS — O1414 Severe pre-eclampsia complicating childbirth: Secondary | ICD-10-CM | POA: Diagnosis not present

## 2022-07-02 DIAGNOSIS — O3413 Maternal care for benign tumor of corpus uteri, third trimester: Secondary | ICD-10-CM | POA: Diagnosis not present

## 2022-07-02 DIAGNOSIS — O3663X0 Maternal care for excessive fetal growth, third trimester, not applicable or unspecified: Secondary | ICD-10-CM | POA: Diagnosis not present

## 2022-07-02 DIAGNOSIS — O113 Pre-existing hypertension with pre-eclampsia, third trimester: Secondary | ICD-10-CM | POA: Diagnosis not present

## 2022-07-04 DIAGNOSIS — O1490 Unspecified pre-eclampsia, unspecified trimester: Secondary | ICD-10-CM | POA: Diagnosis not present

## 2022-07-04 DIAGNOSIS — N883 Incompetence of cervix uteri: Secondary | ICD-10-CM | POA: Diagnosis not present

## 2022-07-05 DIAGNOSIS — O1490 Unspecified pre-eclampsia, unspecified trimester: Secondary | ICD-10-CM | POA: Diagnosis not present

## 2022-07-07 DIAGNOSIS — O139 Gestational [pregnancy-induced] hypertension without significant proteinuria, unspecified trimester: Secondary | ICD-10-CM | POA: Diagnosis not present

## 2022-07-16 DIAGNOSIS — I119 Hypertensive heart disease without heart failure: Secondary | ICD-10-CM | POA: Diagnosis not present

## 2022-07-16 DIAGNOSIS — Z8759 Personal history of other complications of pregnancy, childbirth and the puerperium: Secondary | ICD-10-CM | POA: Diagnosis not present

## 2022-07-20 DIAGNOSIS — Z0130 Encounter for examination of blood pressure without abnormal findings: Secondary | ICD-10-CM | POA: Diagnosis not present

## 2022-07-20 DIAGNOSIS — I87309 Chronic venous hypertension (idiopathic) without complications of unspecified lower extremity: Secondary | ICD-10-CM | POA: Diagnosis not present

## 2022-07-20 DIAGNOSIS — O34219 Maternal care for unspecified type scar from previous cesarean delivery: Secondary | ICD-10-CM | POA: Diagnosis not present

## 2022-07-27 DIAGNOSIS — R609 Edema, unspecified: Secondary | ICD-10-CM | POA: Diagnosis not present

## 2022-08-06 DIAGNOSIS — Z113 Encounter for screening for infections with a predominantly sexual mode of transmission: Secondary | ICD-10-CM | POA: Diagnosis not present

## 2022-08-06 DIAGNOSIS — Z308 Encounter for other contraceptive management: Secondary | ICD-10-CM | POA: Diagnosis not present

## 2022-08-06 DIAGNOSIS — Z01419 Encounter for gynecological examination (general) (routine) without abnormal findings: Secondary | ICD-10-CM | POA: Diagnosis not present

## 2022-08-06 DIAGNOSIS — Z124 Encounter for screening for malignant neoplasm of cervix: Secondary | ICD-10-CM | POA: Diagnosis not present

## 2022-08-13 DIAGNOSIS — N920 Excessive and frequent menstruation with regular cycle: Secondary | ICD-10-CM | POA: Diagnosis not present

## 2022-08-13 DIAGNOSIS — F411 Generalized anxiety disorder: Secondary | ICD-10-CM | POA: Diagnosis not present

## 2022-08-13 DIAGNOSIS — Z309 Encounter for contraceptive management, unspecified: Secondary | ICD-10-CM | POA: Diagnosis not present

## 2022-08-13 DIAGNOSIS — F321 Major depressive disorder, single episode, moderate: Secondary | ICD-10-CM | POA: Diagnosis not present

## 2022-08-31 NOTE — Progress Notes (Addendum)
Patient ID: Desiree Stewart, female    DOB: 01-Jan-1986, 37 y.o.   MRN: [id number]  Chief Complaint  Patient presents with   Pre-op Exam      ICD-10-CM   1. Symptomatic mammary hypertrophy  N62        History of Present Illness: Desiree Stewart is a 37 y.o.  female  with a history of macromastia.  She presents for preoperative evaluation for upcoming procedure, bilateral breast reduction, scheduled for 09/21/2022 with Dr.  Lovena Stewart .  The patient has never had anesthesia.  Denies any personal history of cardiac or pulmonary disease, cancer, or use of blood thinners.  No personal or family history of blood clots or clotting disorder.  She recently started the norethindrone progesterone only pill.  She states that she occasionally will experience hives as a result of NSAIDs, but still takes them periodically.  Confirms that she is a 34 G cup and would like to be D cup postoperatively.  Discussed limitations due to her large size.  Denies any personal history of keloiding, but discussed risk of scarring.  Blood pressure mildly elevated at today's encounter, but tells me that it is normal when she checks it at home and endorses whitecoat syndrome.  Summary of Previous Visit: She was seen for initial consult Dr. Erin Hearing at 16 2023 and then again by Dr. Lovena Stewart on 06/04/2022.  At that time, she had already completed PT without any considerable improvement in her symptoms of macromastia.  She expressed continued interest in breast reduction surgery.  Grade 3 ptosis was noted on exam.  Recent mammogram 04/2022 was negative.  Estimated excess breast tissue removed at time of surgery equals 700 g each side.  STN 34 cm on the right, 35 cm on the left.  Job: Scientist, forensic, computer-based position.  PMH Significant for: Macromastia, family history of breast cancer.   Past Medical History: Allergies: Allergies  Allergen Reactions   Black Walnut Flavor Anaphylaxis   Ibuprofen Hives    Latex Itching    Vaginal irritation and redness.    Nsaids Hives    IBU "sometimes" causing eye itching & swelling    Current Medications:  Current Outpatient Medications:    LARIN FE 1/20 1-20 MG-MCG tablet, Take 1 tablet by mouth daily., Disp: , Rfl:    ondansetron (ZOFRAN-ODT) 4 MG disintegrating tablet, Take 1 tablet (4 mg total) by mouth every 8 (eight) hours as needed for nausea or vomiting., Disp: 20 tablet, Rfl: 0   oxyCODONE (ROXICODONE) 5 MG immediate release tablet, Take 1 tablet (5 mg total) by mouth every 6 (six) hours as needed for up to 5 days for severe pain., Disp: 20 tablet, Rfl: 0   propranolol (INDERAL) 20 MG tablet, Take by mouth. TAKE 1 TABLET BY MOUTH ONCE DAILY AS NEEDED TAKE 60-90 MINUTES PRIOR TO ANXIETY-PROVOKING EVENT, Disp: , Rfl:   Past Medical Problems: Past Medical History:  Diagnosis Date   Bacterial vaginosis    Family history of breast cancer 01/18/2022   Hypertension     Past Surgical History: No past surgical history on file.  Social History: Social History   Socioeconomic History   Marital status: Single    Spouse name: Not on file   Number of children: Not on file   Years of education: Not on file   Highest education level: Not on file  Occupational History   Not on file  Tobacco Use   Smoking status: Never   Smokeless  tobacco: Never  Vaping Use   Vaping Use: Never used  Substance and Sexual Activity   Alcohol use: No   Drug use: No   Sexual activity: Not on file  Other Topics Concern   Not on file  Social History Narrative   Not on file   Social Determinants of Health   Financial Resource Strain: Not on file  Food Insecurity: Not on file  Transportation Needs: Not on file  Physical Activity: Not on file  Stress: Not on file  Social Connections: Not on file  Intimate Partner Violence: Not on file    Family History: Family History  Problem Relation Age of Onset   Breast cancer Mother 72   Hypertension Mother     Breast cancer Paternal Grandmother 2 - 30    Review of Systems: ROS Denies recent fevers, chest pain, shortness of breath, or leg swelling.  Physical Exam: Vital Signs BP (!) 158/109 (BP Location: Left Arm, Patient Position: Sitting, Cuff Size: Large) Comment: pt reports she has white coat syndrome  Pulse 70   Ht 5' 5"$  (1.651 m)   Wt 179 lb 6.4 oz (81.4 kg)   SpO2 100%   BMI 29.85 kg/m   Physical Exam Constitutional:      General: Not in acute distress.    Appearance: Normal appearance. Not ill-appearing.  HENT:     Head: Normocephalic and atraumatic.  Eyes:     Pupils: Pupils are equal, round. Cardiovascular:     Rate and Rhythm: Normal rate.    Pulses: Normal pulses.  Pulmonary:     Effort: No respiratory distress or increased work of breathing.  Speaks in full sentences. Abdominal:     General: Abdomen is flat. No distension.   Musculoskeletal: Normal range of motion. No lower extremity swelling or edema. No varicosities. Skin:    General: Skin is warm and dry.     Findings: No erythema or rash.  Neurological:     Mental Status: Alert and oriented to person, place, and time.  Psychiatric:        Mood and Affect: Mood normal.        Behavior: Behavior normal.    Assessment/Plan: The patient is scheduled for bilateral breast reduction with Dr.  Lovena Stewart .  Risks, benefits, and alternatives of procedure discussed, questions answered and consent obtained.    Smoking Status: Non-smoker. Last Mammogram: 04/2022; Results: BI-RADS Category 1: Negative.  Caprini Score: 3; Risk Factors include: BMI greater than 25 and length of planned surgery. Recommendation for mechanical prophylaxis. Encourage early ambulation.   Pictures obtained: 01/22/2022  Post-op Rx sent to pharmacy: Oxycodone and Zofran. Hydroxyzine was also added given her anxiety and white coat syndrome for day of surgery.    Patient was provided with the General Surgical Risk consent document and Pain  Medication Agreement prior to their appointment.  They had adequate time to read through the risk consent documents and Pain Medication Agreement. We also discussed them in person together during this preop appointment. All of their questions were answered to their satisfaction.  Recommended calling if they have any further questions.  Risk consent form and Pain Medication Agreement to be scanned into patient's chart.  The risk that can be encountered with breast reduction were discussed and include the following but not limited to these:  Breast asymmetry, fluid accumulation, firmness of the breast, inability to breast feed, loss of nipple or areola, skin loss, decrease or no nipple sensation, fat necrosis of the breast  tissue, bleeding, infection, healing delay.  There are risks of anesthesia, changes to skin sensation and injury to nerves or blood vessels.  The muscle can be temporarily or permanently injured.  You may have an allergic reaction to tape, suture, glue, blood products which can result in skin discoloration, swelling, pain, skin lesions, poor healing.  Any of these can lead to the need for revisonal surgery or stage procedures.  A reduction has potential to interfere with diagnostic procedures.  Nipple or breast piercing can increase risks of infection.  This procedure is best done when the breast is fully developed.  Changes in the breast will continue to occur over time.  Pregnancy can alter the outcomes of previous breast reduction surgery, weight gain and weigh loss can also effect the long term appearance.     Electronically signed by: Krista Blue, PA-C 09/01/2022 10:28 AM

## 2022-08-31 NOTE — H&P (View-Only) (Signed)
Patient ID: Desiree Stewart, female    DOB: 01-Jan-1986, 37 y.o.   MRN: [id number]  Chief Complaint  Patient presents with   Pre-op Exam      ICD-10-CM   1. Symptomatic mammary hypertrophy  N62        History of Present Illness: Desiree Stewart is a 37 y.o.  female  with a history of macromastia.  She presents for preoperative evaluation for upcoming procedure, bilateral breast reduction, scheduled for 09/21/2022 with Dr.  Lovena Le .  The patient has never had anesthesia.  Denies any personal history of cardiac or pulmonary disease, cancer, or use of blood thinners.  No personal or family history of blood clots or clotting disorder.  She recently started the norethindrone progesterone only pill.  She states that she occasionally will experience hives as a result of NSAIDs, but still takes them periodically.  Confirms that she is a 34 G cup and would like to be D cup postoperatively.  Discussed limitations due to her large size.  Denies any personal history of keloiding, but discussed risk of scarring.  Blood pressure mildly elevated at today's encounter, but tells me that it is normal when she checks it at home and endorses whitecoat syndrome.  Summary of Previous Visit: She was seen for initial consult Dr. Erin Hearing at 16 2023 and then again by Dr. Lovena Le on 06/04/2022.  At that time, she had already completed PT without any considerable improvement in her symptoms of macromastia.  She expressed continued interest in breast reduction surgery.  Grade 3 ptosis was noted on exam.  Recent mammogram 04/2022 was negative.  Estimated excess breast tissue removed at time of surgery equals 700 g each side.  STN 34 cm on the right, 35 cm on the left.  Job: Scientist, forensic, computer-based position.  PMH Significant for: Macromastia, family history of breast cancer.   Past Medical History: Allergies: Allergies  Allergen Reactions   Black Walnut Flavor Anaphylaxis   Ibuprofen Hives    Latex Itching    Vaginal irritation and redness.    Nsaids Hives    IBU "sometimes" causing eye itching & swelling    Current Medications:  Current Outpatient Medications:    LARIN FE 1/20 1-20 MG-MCG tablet, Take 1 tablet by mouth daily., Disp: , Rfl:    ondansetron (ZOFRAN-ODT) 4 MG disintegrating tablet, Take 1 tablet (4 mg total) by mouth every 8 (eight) hours as needed for nausea or vomiting., Disp: 20 tablet, Rfl: 0   oxyCODONE (ROXICODONE) 5 MG immediate release tablet, Take 1 tablet (5 mg total) by mouth every 6 (six) hours as needed for up to 5 days for severe pain., Disp: 20 tablet, Rfl: 0   propranolol (INDERAL) 20 MG tablet, Take by mouth. TAKE 1 TABLET BY MOUTH ONCE DAILY AS NEEDED TAKE 60-90 MINUTES PRIOR TO ANXIETY-PROVOKING EVENT, Disp: , Rfl:   Past Medical Problems: Past Medical History:  Diagnosis Date   Bacterial vaginosis    Family history of breast cancer 01/18/2022   Hypertension     Past Surgical History: No past surgical history on file.  Social History: Social History   Socioeconomic History   Marital status: Single    Spouse name: Not on file   Number of children: Not on file   Years of education: Not on file   Highest education level: Not on file  Occupational History   Not on file  Tobacco Use   Smoking status: Never   Smokeless  tobacco: Never  Vaping Use   Vaping Use: Never used  Substance and Sexual Activity   Alcohol use: No   Drug use: No   Sexual activity: Not on file  Other Topics Concern   Not on file  Social History Narrative   Not on file   Social Determinants of Health   Financial Resource Strain: Not on file  Food Insecurity: Not on file  Transportation Needs: Not on file  Physical Activity: Not on file  Stress: Not on file  Social Connections: Not on file  Intimate Partner Violence: Not on file    Family History: Family History  Problem Relation Age of Onset   Breast cancer Mother 72   Hypertension Mother     Breast cancer Paternal Grandmother 2 - 30    Review of Systems: ROS Denies recent fevers, chest pain, shortness of breath, or leg swelling.  Physical Exam: Vital Signs BP (!) 158/109 (BP Location: Left Arm, Patient Position: Sitting, Cuff Size: Large) Comment: pt reports she has white coat syndrome  Pulse 70   Ht 5' 5"$  (1.651 m)   Wt 179 lb 6.4 oz (81.4 kg)   SpO2 100%   BMI 29.85 kg/m   Physical Exam Constitutional:      General: Not in acute distress.    Appearance: Normal appearance. Not ill-appearing.  HENT:     Head: Normocephalic and atraumatic.  Eyes:     Pupils: Pupils are equal, round. Cardiovascular:     Rate and Rhythm: Normal rate.    Pulses: Normal pulses.  Pulmonary:     Effort: No respiratory distress or increased work of breathing.  Speaks in full sentences. Abdominal:     General: Abdomen is flat. No distension.   Musculoskeletal: Normal range of motion. No lower extremity swelling or edema. No varicosities. Skin:    General: Skin is warm and dry.     Findings: No erythema or rash.  Neurological:     Mental Status: Alert and oriented to person, place, and time.  Psychiatric:        Mood and Affect: Mood normal.        Behavior: Behavior normal.    Assessment/Plan: The patient is scheduled for bilateral breast reduction with Dr.  Lovena Le .  Risks, benefits, and alternatives of procedure discussed, questions answered and consent obtained.    Smoking Status: Non-smoker. Last Mammogram: 04/2022; Results: BI-RADS Category 1: Negative.  Caprini Score: 3; Risk Factors include: BMI greater than 25 and length of planned surgery. Recommendation for mechanical prophylaxis. Encourage early ambulation.   Pictures obtained: 01/22/2022  Post-op Rx sent to pharmacy: Oxycodone and Zofran. Hydroxyzine was also added given her anxiety and white coat syndrome for day of surgery.    Patient was provided with the General Surgical Risk consent document and Pain  Medication Agreement prior to their appointment.  They had adequate time to read through the risk consent documents and Pain Medication Agreement. We also discussed them in person together during this preop appointment. All of their questions were answered to their satisfaction.  Recommended calling if they have any further questions.  Risk consent form and Pain Medication Agreement to be scanned into patient's chart.  The risk that can be encountered with breast reduction were discussed and include the following but not limited to these:  Breast asymmetry, fluid accumulation, firmness of the breast, inability to breast feed, loss of nipple or areola, skin loss, decrease or no nipple sensation, fat necrosis of the breast  tissue, bleeding, infection, healing delay.  There are risks of anesthesia, changes to skin sensation and injury to nerves or blood vessels.  The muscle can be temporarily or permanently injured.  You may have an allergic reaction to tape, suture, glue, blood products which can result in skin discoloration, swelling, pain, skin lesions, poor healing.  Any of these can lead to the need for revisonal surgery or stage procedures.  A reduction has potential to interfere with diagnostic procedures.  Nipple or breast piercing can increase risks of infection.  This procedure is best done when the breast is fully developed.  Changes in the breast will continue to occur over time.  Pregnancy can alter the outcomes of previous breast reduction surgery, weight gain and weigh loss can also effect the long term appearance.     Electronically signed by: Krista Blue, PA-C 09/01/2022 10:28 AM

## 2022-09-01 ENCOUNTER — Encounter: Payer: Self-pay | Admitting: Physician Assistant

## 2022-09-01 ENCOUNTER — Ambulatory Visit (INDEPENDENT_AMBULATORY_CARE_PROVIDER_SITE_OTHER): Payer: BC Managed Care – PPO | Admitting: Physician Assistant

## 2022-09-01 VITALS — BP 158/109 | HR 70 | Ht 65.0 in | Wt 179.4 lb

## 2022-09-01 DIAGNOSIS — N62 Hypertrophy of breast: Secondary | ICD-10-CM

## 2022-09-01 MED ORDER — OXYCODONE HCL 5 MG PO TABS: 5.0000 mg | ORAL_TABLET | Freq: Four times a day (QID) | ORAL | 0 refills | Status: AC | PRN

## 2022-09-01 MED ORDER — ONDANSETRON 4 MG PO TBDP: 4.0000 mg | ORAL_TABLET | Freq: Three times a day (TID) | ORAL | 0 refills | Status: DC | PRN

## 2022-09-03 DIAGNOSIS — I10 Essential (primary) hypertension: Secondary | ICD-10-CM | POA: Diagnosis not present

## 2022-09-03 DIAGNOSIS — Z8759 Personal history of other complications of pregnancy, childbirth and the puerperium: Secondary | ICD-10-CM | POA: Diagnosis not present

## 2022-09-06 DIAGNOSIS — D649 Anemia, unspecified: Secondary | ICD-10-CM | POA: Diagnosis not present

## 2022-09-06 DIAGNOSIS — Z3009 Encounter for other general counseling and advice on contraception: Secondary | ICD-10-CM | POA: Diagnosis not present

## 2022-09-06 DIAGNOSIS — E559 Vitamin D deficiency, unspecified: Secondary | ICD-10-CM | POA: Diagnosis not present

## 2022-09-09 DIAGNOSIS — Z719 Counseling, unspecified: Secondary | ICD-10-CM

## 2022-09-14 ENCOUNTER — Other Ambulatory Visit: Payer: Self-pay

## 2022-09-14 ENCOUNTER — Encounter (HOSPITAL_BASED_OUTPATIENT_CLINIC_OR_DEPARTMENT_OTHER): Payer: Self-pay | Admitting: Plastic Surgery

## 2022-09-15 MED ORDER — HYDROXYZINE PAMOATE 25 MG PO CAPS: 25.0000 mg | ORAL_CAPSULE | Freq: Three times a day (TID) | ORAL | 0 refills | Status: DC | PRN

## 2022-09-15 NOTE — Addendum Note (Signed)
Addended by: Krista Blue on: 09/15/2022 01:07 PM   Modules accepted: Orders

## 2022-09-20 ENCOUNTER — Encounter (HOSPITAL_BASED_OUTPATIENT_CLINIC_OR_DEPARTMENT_OTHER)
Admission: RE | Admit: 2022-09-20 | Discharge: 2022-09-20 | Disposition: A | Discharge status: Home / Self Care | Payer: BC Managed Care – PPO | Source: Ambulatory Visit | Attending: Plastic Surgery | Admitting: Plastic Surgery

## 2022-09-20 DIAGNOSIS — N62 Hypertrophy of breast: Secondary | ICD-10-CM | POA: Diagnosis not present

## 2022-09-20 DIAGNOSIS — M542 Cervicalgia: Secondary | ICD-10-CM | POA: Diagnosis not present

## 2022-09-20 DIAGNOSIS — Z01810 Encounter for preprocedural cardiovascular examination: Secondary | ICD-10-CM | POA: Insufficient documentation

## 2022-09-20 DIAGNOSIS — R21 Rash and other nonspecific skin eruption: Secondary | ICD-10-CM | POA: Diagnosis not present

## 2022-09-20 DIAGNOSIS — L918 Other hypertrophic disorders of the skin: Secondary | ICD-10-CM | POA: Diagnosis not present

## 2022-09-20 DIAGNOSIS — I10 Essential (primary) hypertension: Secondary | ICD-10-CM | POA: Diagnosis not present

## 2022-09-20 DIAGNOSIS — Z803 Family history of malignant neoplasm of breast: Secondary | ICD-10-CM | POA: Diagnosis not present

## 2022-09-20 DIAGNOSIS — M549 Dorsalgia, unspecified: Secondary | ICD-10-CM | POA: Diagnosis not present

## 2022-09-21 ENCOUNTER — Encounter (HOSPITAL_BASED_OUTPATIENT_CLINIC_OR_DEPARTMENT_OTHER)
Admission: RE | Disposition: A | Discharge status: Home / Self Care | Payer: Self-pay | Source: Ambulatory Visit | Attending: Plastic Surgery

## 2022-09-21 ENCOUNTER — Encounter (HOSPITAL_BASED_OUTPATIENT_CLINIC_OR_DEPARTMENT_OTHER): Payer: Self-pay | Admitting: Plastic Surgery

## 2022-09-21 ENCOUNTER — Ambulatory Visit (HOSPITAL_BASED_OUTPATIENT_CLINIC_OR_DEPARTMENT_OTHER): Payer: BC Managed Care – PPO | Admitting: Anesthesiology

## 2022-09-21 ENCOUNTER — Ambulatory Visit (HOSPITAL_BASED_OUTPATIENT_CLINIC_OR_DEPARTMENT_OTHER)
Admission: RE | Admit: 2022-09-21 | Discharge: 2022-09-21 | Disposition: A | Discharge status: Home / Self Care | Payer: BC Managed Care – PPO | Source: Ambulatory Visit | Attending: Plastic Surgery | Admitting: Plastic Surgery

## 2022-09-21 ENCOUNTER — Other Ambulatory Visit: Payer: Self-pay

## 2022-09-21 DIAGNOSIS — Z803 Family history of malignant neoplasm of breast: Secondary | ICD-10-CM | POA: Diagnosis not present

## 2022-09-21 DIAGNOSIS — L918 Other hypertrophic disorders of the skin: Secondary | ICD-10-CM

## 2022-09-21 DIAGNOSIS — N62 Hypertrophy of breast: Secondary | ICD-10-CM

## 2022-09-21 DIAGNOSIS — M542 Cervicalgia: Secondary | ICD-10-CM | POA: Insufficient documentation

## 2022-09-21 DIAGNOSIS — I10 Essential (primary) hypertension: Secondary | ICD-10-CM | POA: Diagnosis not present

## 2022-09-21 DIAGNOSIS — M549 Dorsalgia, unspecified: Secondary | ICD-10-CM | POA: Insufficient documentation

## 2022-09-21 DIAGNOSIS — R21 Rash and other nonspecific skin eruption: Secondary | ICD-10-CM | POA: Insufficient documentation

## 2022-09-21 DIAGNOSIS — Z01818 Encounter for other preprocedural examination: Secondary | ICD-10-CM

## 2022-09-21 HISTORY — PX: BREAST REDUCTION SURGERY: SHX8

## 2022-09-21 HISTORY — PX: EXCISION OF SKIN TAG: SHX6270

## 2022-09-21 LAB — POCT PREGNANCY, URINE: Preg Test, Ur: NEGATIVE

## 2022-09-21 SURGERY — MAMMOPLASTY, REDUCTION
Anesthesia: General | Site: Breast | Laterality: Right

## 2022-09-21 MED ORDER — FENTANYL CITRATE (PF) 100 MCG/2ML IJ SOLN
INTRAMUSCULAR | Status: DC | PRN
  Administered 2022-09-21: 100 ug via INTRAVENOUS
  Administered 2022-09-21 (×2): 50 ug via INTRAVENOUS

## 2022-09-21 MED ORDER — CEFAZOLIN SODIUM-DEXTROSE 2-4 GM/100ML-% IV SOLN
2.0000 g | INTRAVENOUS | Status: AC
  Administered 2022-09-21: 2 g via INTRAVENOUS

## 2022-09-21 MED ORDER — CELECOXIB 200 MG PO CAPS
ORAL_CAPSULE | ORAL | Status: AC
  Filled 2022-09-21: qty 1

## 2022-09-21 MED ORDER — PHENYLEPHRINE HCL (PRESSORS) 10 MG/ML IV SOLN
INTRAVENOUS | Status: DC | PRN
  Administered 2022-09-21 (×2): 40 ug via INTRAVENOUS

## 2022-09-21 MED ORDER — EPHEDRINE 5 MG/ML INJ
INTRAVENOUS | Status: AC
  Filled 2022-09-21: qty 5

## 2022-09-21 MED ORDER — LACTATED RINGERS IV SOLN: INTRAVENOUS | Status: DC

## 2022-09-21 MED ORDER — SCOPOLAMINE 1 MG/3DAYS TD PT72
MEDICATED_PATCH | TRANSDERMAL | Status: AC
  Filled 2022-09-21: qty 1

## 2022-09-21 MED ORDER — BACITRACIN-NEOMYCIN-POLYMYXIN OINTMENT TUBE
TOPICAL_OINTMENT | CUTANEOUS | Status: AC
  Filled 2022-09-21: qty 14.17

## 2022-09-21 MED ORDER — CEFAZOLIN SODIUM-DEXTROSE 2-4 GM/100ML-% IV SOLN
INTRAVENOUS | Status: AC
  Filled 2022-09-21: qty 100

## 2022-09-21 MED ORDER — ONDANSETRON HCL 4 MG/2ML IJ SOLN
INTRAMUSCULAR | Status: DC | PRN
  Administered 2022-09-21: 4 mg via INTRAVENOUS

## 2022-09-21 MED ORDER — CHLORHEXIDINE GLUCONATE CLOTH 2 % EX PADS: 6.0000 | MEDICATED_PAD | Freq: Once | CUTANEOUS | Status: DC

## 2022-09-21 MED ORDER — FENTANYL CITRATE (PF) 100 MCG/2ML IJ SOLN
INTRAMUSCULAR | Status: AC
  Filled 2022-09-21: qty 2

## 2022-09-21 MED ORDER — MINERAL OIL LIGHT OIL
TOPICAL_OIL | Status: AC
  Filled 2022-09-21: qty 10

## 2022-09-21 MED ORDER — DEXAMETHASONE SODIUM PHOSPHATE 10 MG/ML IJ SOLN
INTRAMUSCULAR | Status: AC
  Filled 2022-09-21: qty 1

## 2022-09-21 MED ORDER — LIDOCAINE 2% (20 MG/ML) 5 ML SYRINGE
INTRAMUSCULAR | Status: AC
  Filled 2022-09-21: qty 5

## 2022-09-21 MED ORDER — 0.9 % SODIUM CHLORIDE (POUR BTL) OPTIME
TOPICAL | Status: DC | PRN
  Administered 2022-09-21: 1000 mL

## 2022-09-21 MED ORDER — DEXAMETHASONE SODIUM PHOSPHATE 4 MG/ML IJ SOLN
INTRAMUSCULAR | Status: DC | PRN
  Administered 2022-09-21: 5 mg via INTRAVENOUS

## 2022-09-21 MED ORDER — MIDAZOLAM HCL 5 MG/5ML IJ SOLN
INTRAMUSCULAR | Status: DC | PRN
  Administered 2022-09-21: 2 mg via INTRAVENOUS

## 2022-09-21 MED ORDER — EPINEPHRINE PF 1 MG/ML IJ SOLN
INTRAMUSCULAR | Status: AC
  Filled 2022-09-21: qty 1

## 2022-09-21 MED ORDER — ROCURONIUM BROMIDE 100 MG/10ML IV SOLN
INTRAVENOUS | Status: DC | PRN
  Administered 2022-09-21: 70 mg via INTRAVENOUS

## 2022-09-21 MED ORDER — ATROPINE SULFATE 0.4 MG/ML IV SOLN
INTRAVENOUS | Status: AC
  Filled 2022-09-21: qty 1

## 2022-09-21 MED ORDER — ROCURONIUM BROMIDE 10 MG/ML (PF) SYRINGE
PREFILLED_SYRINGE | INTRAVENOUS | Status: AC
  Filled 2022-09-21: qty 10

## 2022-09-21 MED ORDER — AMISULPRIDE (ANTIEMETIC) 5 MG/2ML IV SOLN: 10.0000 mg | Freq: Once | INTRAVENOUS | Status: DC | PRN

## 2022-09-21 MED ORDER — OXYCODONE HCL 5 MG PO TABS
5.0000 mg | ORAL_TABLET | Freq: Once | ORAL | Status: AC
  Administered 2022-09-21: 5 mg via ORAL

## 2022-09-21 MED ORDER — LIDOCAINE-EPINEPHRINE 1 %-1:100000 IJ SOLN
INTRAMUSCULAR | Status: DC | PRN
  Administered 2022-09-21: 4 mL

## 2022-09-21 MED ORDER — ACETAMINOPHEN 10 MG/ML IV SOLN: 1000.0000 mg | Freq: Once | INTRAVENOUS | Status: DC | PRN

## 2022-09-21 MED ORDER — LIDOCAINE HCL (PF) 1 % IJ SOLN
INTRAMUSCULAR | Status: AC
  Filled 2022-09-21: qty 30

## 2022-09-21 MED ORDER — SCOPOLAMINE 1 MG/3DAYS TD PT72
1.0000 | MEDICATED_PATCH | TRANSDERMAL | Status: DC
  Administered 2022-09-21: 1.5 mg via TRANSDERMAL

## 2022-09-21 MED ORDER — PROPOFOL 10 MG/ML IV BOLUS
INTRAVENOUS | Status: DC | PRN
  Administered 2022-09-21: 200 mg via INTRAVENOUS

## 2022-09-21 MED ORDER — LIDOCAINE HCL (CARDIAC) PF 100 MG/5ML IV SOSY
PREFILLED_SYRINGE | INTRAVENOUS | Status: DC | PRN
  Administered 2022-09-21: 60 mg via INTRAVENOUS

## 2022-09-21 MED ORDER — ACETAMINOPHEN 500 MG PO TABS
1000.0000 mg | ORAL_TABLET | Freq: Once | ORAL | Status: AC
  Administered 2022-09-21: 1000 mg via ORAL

## 2022-09-21 MED ORDER — BUPIVACAINE HCL (PF) 0.25 % IJ SOLN
INTRAMUSCULAR | Status: AC
  Filled 2022-09-21: qty 30

## 2022-09-21 MED ORDER — BSS IO SOLN
INTRAOCULAR | Status: AC
  Filled 2022-09-21: qty 15

## 2022-09-21 MED ORDER — OXYCODONE HCL 5 MG PO TABS
ORAL_TABLET | ORAL | Status: AC
  Filled 2022-09-21: qty 1

## 2022-09-21 MED ORDER — MIDAZOLAM HCL 2 MG/2ML IJ SOLN
INTRAMUSCULAR | Status: AC
  Filled 2022-09-21: qty 2

## 2022-09-21 MED ORDER — LIDOCAINE-EPINEPHRINE 1 %-1:100000 IJ SOLN
INTRAMUSCULAR | Status: AC
  Filled 2022-09-21: qty 1

## 2022-09-21 MED ORDER — ACETAMINOPHEN 500 MG PO TABS
ORAL_TABLET | ORAL | Status: AC
  Filled 2022-09-21: qty 2

## 2022-09-21 MED ORDER — HYDROMORPHONE HCL 1 MG/ML IJ SOLN
INTRAMUSCULAR | Status: DC | PRN
  Administered 2022-09-21 (×2): .5 mg via INTRAVENOUS

## 2022-09-21 MED ORDER — SUCCINYLCHOLINE CHLORIDE 200 MG/10ML IV SOSY
PREFILLED_SYRINGE | INTRAVENOUS | Status: AC
  Filled 2022-09-21: qty 10

## 2022-09-21 MED ORDER — EPHEDRINE SULFATE (PRESSORS) 50 MG/ML IJ SOLN
INTRAMUSCULAR | Status: DC | PRN
  Administered 2022-09-21 (×2): 10 mg via INTRAVENOUS

## 2022-09-21 MED ORDER — ONDANSETRON HCL 4 MG/2ML IJ SOLN
INTRAMUSCULAR | Status: AC
  Filled 2022-09-21: qty 2

## 2022-09-21 MED ORDER — HYDROMORPHONE HCL 1 MG/ML IJ SOLN
INTRAMUSCULAR | Status: AC
  Filled 2022-09-21: qty 1

## 2022-09-21 MED ORDER — PHENYLEPHRINE 80 MCG/ML (10ML) SYRINGE FOR IV PUSH (FOR BLOOD PRESSURE SUPPORT)
PREFILLED_SYRINGE | INTRAVENOUS | Status: AC
  Filled 2022-09-21: qty 10

## 2022-09-21 MED ORDER — FENTANYL CITRATE (PF) 100 MCG/2ML IJ SOLN
25.0000 ug | INTRAMUSCULAR | Status: DC | PRN
  Administered 2022-09-21 (×2): 50 ug via INTRAVENOUS

## 2022-09-21 SURGICAL SUPPLY — 78 items
ADH SKN CLS APL DERMABOND .7 (GAUZE/BANDAGES/DRESSINGS) ×8
APL PRP STRL LF DISP 70% ISPRP (MISCELLANEOUS) ×4
BAG DECANTER FOR FLEXI CONT (MISCELLANEOUS) IMPLANT
BINDER BREAST 3XL (GAUZE/BANDAGES/DRESSINGS) IMPLANT
BINDER BREAST LRG (GAUZE/BANDAGES/DRESSINGS) IMPLANT
BINDER BREAST MEDIUM (GAUZE/BANDAGES/DRESSINGS) IMPLANT
BINDER BREAST XLRG (GAUZE/BANDAGES/DRESSINGS) IMPLANT
BINDER BREAST XXLRG (GAUZE/BANDAGES/DRESSINGS) IMPLANT
BIOPATCH RED 1 DISK 7.0 (GAUZE/BANDAGES/DRESSINGS) IMPLANT
BLADE SURG 10 STRL SS (BLADE) ×12 IMPLANT
BLADE SURG 15 STRL LF DISP TIS (BLADE) ×2 IMPLANT
BLADE SURG 15 STRL SS (BLADE) ×2
CANISTER SUCT 1200ML W/VALVE (MISCELLANEOUS) ×2 IMPLANT
CHLORAPREP W/TINT 26 (MISCELLANEOUS) ×4 IMPLANT
DERMABOND ADVANCED .7 DNX12 (GAUZE/BANDAGES/DRESSINGS) ×4 IMPLANT
DRAIN CHANNEL 19F RND (DRAIN) IMPLANT
DRSG TEGADERM 4X4.75 (GAUZE/BANDAGES/DRESSINGS) IMPLANT
ELECT BLADE 4.0 EZ CLEAN MEGAD (MISCELLANEOUS) ×2
ELECT COATED BLADE 2.86 ST (ELECTRODE) ×2 IMPLANT
ELECT REM PT RETURN 9FT ADLT (ELECTROSURGICAL) ×4
ELECTRODE BLDE 4.0 EZ CLN MEGD (MISCELLANEOUS) IMPLANT
ELECTRODE REM PT RTRN 9FT ADLT (ELECTROSURGICAL) ×4 IMPLANT
EVACUATOR SILICONE 100CC (DRAIN) IMPLANT
GAUZE PAD ABD 8X10 STRL (GAUZE/BANDAGES/DRESSINGS) ×4 IMPLANT
GAUZE SPONGE 4X4 12PLY STRL LF (GAUZE/BANDAGES/DRESSINGS) IMPLANT
GLOVE BIO SURGEON STRL SZ8 (GLOVE) ×4 IMPLANT
GLOVE BIOGEL M STRL SZ7.5 (GLOVE) IMPLANT
GLOVE BIOGEL PI IND STRL 7.0 (GLOVE) IMPLANT
GLOVE BIOGEL PI IND STRL 8 (GLOVE) ×2 IMPLANT
GLOVE SURG SS PI 6.5 STRL IVOR (GLOVE) IMPLANT
GLOVE SURG SS PI 7.5 STRL IVOR (GLOVE) IMPLANT
GLOVE SURG SYN 8.0 (GLOVE) ×4 IMPLANT
GLOVE SURG SYN 8.0 PF PI (GLOVE) IMPLANT
GOWN STRL REUS W/ TWL LRG LVL3 (GOWN DISPOSABLE) ×2 IMPLANT
GOWN STRL REUS W/TWL LRG LVL3 (GOWN DISPOSABLE) ×4
GOWN STRL REUS W/TWL XL LVL3 (GOWN DISPOSABLE) ×4 IMPLANT
HEMOSTAT ARISTA ABSORB 3G PWDR (HEMOSTASIS) IMPLANT
LINER CANISTER 1000CC FLEX (MISCELLANEOUS) IMPLANT
MARKER SKIN DUAL TIP RULER LAB (MISCELLANEOUS) IMPLANT
NDL FILTER BLUNT 18X1 1/2 (NEEDLE) IMPLANT
NDL HYPO 25X1 1.5 SAFETY (NEEDLE) IMPLANT
NDL SAFETY ECLIP 18X1.5 (MISCELLANEOUS) ×2 IMPLANT
NDL SPNL 18GX3.5 QUINCKE PK (NEEDLE) ×2 IMPLANT
NEEDLE FILTER BLUNT 18X1 1/2 (NEEDLE) IMPLANT
NEEDLE HYPO 25X1 1.5 SAFETY (NEEDLE) ×2 IMPLANT
NEEDLE SPNL 18GX3.5 QUINCKE PK (NEEDLE) ×2 IMPLANT
NS IRRIG 1000ML POUR BTL (IV SOLUTION) ×2 IMPLANT
PACK BASIN DAY SURGERY FS (CUSTOM PROCEDURE TRAY) ×2 IMPLANT
PACK UNIVERSAL I (CUSTOM PROCEDURE TRAY) ×2 IMPLANT
PENCIL SMOKE EVACUATOR (MISCELLANEOUS) ×2 IMPLANT
PIN SAFETY STERILE (MISCELLANEOUS) IMPLANT
SHEET MEDIUM DRAPE 40X70 STRL (DRAPES) IMPLANT
SLEEVE SCD COMPRESS KNEE MED (STOCKING) ×2 IMPLANT
SPIKE FLUID TRANSFER (MISCELLANEOUS) IMPLANT
SPONGE T-LAP 18X18 ~~LOC~~+RFID (SPONGE) ×6 IMPLANT
STAPLER INSORB 30 2030 C-SECTI (MISCELLANEOUS) IMPLANT
STAPLER VISISTAT 35W (STAPLE) ×2 IMPLANT
SUT MNCRL AB 3-0 PS2 18 (SUTURE) ×8 IMPLANT
SUT MNCRL AB 3-0 PS2 27 (SUTURE) IMPLANT
SUT MNCRL AB 4-0 PS2 18 (SUTURE) ×4 IMPLANT
SUT MON AB 2-0 CT1 36 (SUTURE) ×4 IMPLANT
SUT MON AB 5-0 PS2 18 (SUTURE) IMPLANT
SUT SILK 2 0 SH (SUTURE) ×4 IMPLANT
SUT VIC AB 3-0 PS1 18 (SUTURE)
SUT VIC AB 3-0 PS1 18XBRD (SUTURE) ×4 IMPLANT
SUT VIC AB 3-0 SH 27 (SUTURE) ×4
SUT VIC AB 3-0 SH 27X BRD (SUTURE) IMPLANT
SYR 50ML LL SCALE MARK (SYRINGE) IMPLANT
SYR BULB IRRIG 60ML STRL (SYRINGE) ×2 IMPLANT
SYR CONTROL 10ML LL (SYRINGE) IMPLANT
SYR TB 1ML LL NO SAFETY (SYRINGE) IMPLANT
TAPE MEASURE VINYL STERILE (MISCELLANEOUS) IMPLANT
TOWEL GREEN STERILE FF (TOWEL DISPOSABLE) ×4 IMPLANT
TUBE CONNECTING 20X1/4 (TUBING) ×2 IMPLANT
TUBING INFILTRATION IT-10001 (TUBING) IMPLANT
TUBING SET GRADUATE ASPIR 12FT (MISCELLANEOUS) IMPLANT
UNDERPAD 30X36 HEAVY ABSORB (UNDERPADS AND DIAPERS) ×4 IMPLANT
YANKAUER SUCT BULB TIP NO VENT (SUCTIONS) ×2 IMPLANT

## 2022-09-21 NOTE — Anesthesia Postprocedure Evaluation (Signed)
Anesthesia Post Note  Patient: Desiree Stewart  Procedure(s) Performed: MAMMARY REDUCTION  (BREAST) (Bilateral: Breast) EXCISION OF SKIN TAG (Right: Axilla)     Patient location during evaluation: PACU Anesthesia Type: General Level of consciousness: awake and alert Pain management: pain level controlled Vital Signs Assessment: post-procedure vital signs reviewed and stable Respiratory status: spontaneous breathing, nonlabored ventilation, respiratory function stable and patient connected to nasal cannula oxygen Cardiovascular status: blood pressure returned to baseline and stable Postop Assessment: no apparent nausea or vomiting Anesthetic complications: no   No notable events documented.  Last Vitals:  Vitals:   09/21/22 1300 09/21/22 1345  BP: (!) 152/92 (!) 152/92  Pulse: 80 80  Resp: 16 18  Temp: (!) 36.4 C (!) 36.4 C  SpO2: 97% 97%    Last Pain:  Vitals:   09/21/22 1345  TempSrc: Tympanic  PainSc:                  March Rummage [name]

## 2022-09-21 NOTE — Op Note (Signed)
DATE OF OPERATION: 09/21/2022  LOCATION: [facility] operating Room  PREOPERATIVE DIAGNOSIS: Symptomatic macromastia, right axillary skin tag  POSTOPERATIVE DIAGNOSIS: Same  PROCEDURE: Bilateral breast reduction, excision of the right axillary skin tag  SURGEON: Jeanann Lewandowsky, MD  ASSISTANT: Krista Blue, primary, Donnamarie Rossetti  EBL: 150 cc  CONDITION: Stable  COMPLICATIONS: None  INDICATION: The patient, Desiree Stewart, is a 37 y.o. female born on Jan 21, 1986, is here for treatment of upper back and neck pain and inframammary rashes, skin tag in the right axilla.  Specimen: Right breast tissue 1010 g, left breast tissue 1280 g.  PROCEDURE DETAILS:  The patient was seen prior to surgery and marked.  The IV antibiotics were given. The patient was taken to the operating room and given a general anesthetic. A standard time out was performed and all information was confirmed by those in the room. SCDs were placed.   The axilla was addressed first and the skin tag was infiltrated with 1% lidocaine with epinephrine and prepped with a Betadine swab the skin tag was excised and disposed of.  The chest was then prepped and draped in usual sterile manner.  The areolas were marked with a 42 mm cookie cutter and an 8 cm based inferior pedicle was marked on the skin.  The right breast was addressed first.  A laparotomy tape was placed at the base of the breast as a tourniquet and the pedicle was de-epithelialized sharply the electrocautery was used to define and dissected the pedicle down to the chest wall the electrocautery was then used to excise the medial lateral and superior triangles of breast tissue as well as create the superior skin flap.  All breast tissue excised was sent to pathology for routine examination.  The surgical wound was irrigated and meticulous hemostasis was achieved with electrocautery.  The surgical wound was coated with Arista hemostatic agent and a 19 [place] Blake drain  was placed behind the pedicle and brought out through a separate stab incision.  The T point was recreated with a 2-0 Monocryl suture and the skin edges were skin clipped in place.  The dermis was closed with interrupted 3-0 Monocryl sutures and the skin was closed with a running 4-0 subcuticular stitch.  The skin edges were sealed with Dermabond.  The left breast was then addressed in a similar manner.  Laparotomy tape was placed at the base of the breast and the pedicle was de-epithelialized sharply the pedicle was then defined with the electrocautery dissecting down to the chest wall.  The medial lateral and superior triangles of tissue were excised and the superior skin flap was developed with the electrocautery.  While excising the breast tissue there was an area where the breast tissue was densely adherent to the underlying dermis and a 5 mm laceration was made through the skin.  This was repaired with  interrupted 4-0 Monocryl sutures.  The breast tissue was again sent for routine pathology and the wound was irrigated and hemostasis achieved.  The surgical site was covered with Arista and a drain placed and brought out through a separate stab incision.  The dermis was closed with 3-0 Monocryl sutures and the skin was closed with a running 4-0 Monocryl subcuticular stitch.  The skin was sealed with Dermabond.  The patient was placed in a compressive garment and ABD pads were placed on the incisions. The patient was allowed to wake up and taken to recovery room in stable condition at the end of the case.  The family was notified at the end of the case.   The advanced practice practitioner (APP) assisted throughout the case.  The APP was essential in retraction and counter traction when needed to make the case progress smoothly.  This retraction and assistance made it possible to see the tissue plans for the procedure.  The assistance was needed for blood control, tissue re-approximation and assisted with  closure of the incision site.

## 2022-09-21 NOTE — Discharge Instructions (Addendum)
Activity: Avoid strenuous activity.  No lifting, pushing, or pulling greater than 15 pounds.  Diet: No restrictions.  Try to optimize nutrition with plenty of proteins, fruits, and vegetables to improve healing.   Wound Care: Leave breast binder on for the first week and then you may transition to a front-clipping or front-zipping compression bra.  Sponge bathe only until our visit tomorrow, then we can discuss transition to showering with the emphasis on keeping the surgical site dry.  Replace the ABD pads over incisions with gauze or Maxi pads as needed for incisional drainage.   If you have drains placed, be sure to record the daily output from each side. They will be removed at your appointment tomorrow. Please make sure that the bulbs are charged.  If you experience any issues, please call the office.    Follow-Up: Scheduled for tomorrow.  Things to watch for:  Call the office if you experience fever, chills, intractable vomiting, or significant bleeding.  Mild wound drainage is common after breast reduction surgery and should not be cause for alarm.   Post Anesthesia Home Care Instructions  Activity: Get plenty of rest for the remainder of the day. A responsible individual must stay with you for 24 hours following the procedure.  For the next 24 hours, DO NOT: -Drive a car -Paediatric nurse -Drink alcoholic beverages -Take any medication unless instructed by your physician -Make any legal decisions or sign important papers.  Meals: Start with liquid foods such as gelatin or soup. Progress to regular foods as tolerated. Avoid greasy, spicy, heavy foods. If nausea and/or vomiting occur, drink only clear liquids until the nausea and/or vomiting subsides. Call your physician if vomiting continues.  Special Instructions/Symptoms: Your throat may feel dry or sore from the anesthesia or the breathing tube placed in your throat during surgery. If this causes discomfort, gargle with warm  salt water. The discomfort should disappear within 24 hours.  If you had a scopolamine patch placed behind your ear for the management of post- operative nausea and/or vomiting:  1. The medication in the patch is effective for 72 hours, after which it should be removed.  Wrap patch in a tissue and discard in the trash. Wash hands thoroughly with soap and water. 2. You may remove the patch earlier than 72 hours if you experience unpleasant side effects which may include dry mouth, dizziness or visual disturbances. 3. Avoid touching the patch. Wash your hands with soap and water after contact with the patch.  About my Jackson-Pratt Bulb Drain  What is a Jackson-Pratt bulb? A Jackson-Pratt is a soft, round device used to collect drainage. It is connected to a long, thin drainage catheter, which is held in place by one or two small stiches near your surgical incision site. When the bulb is squeezed, it forms a vacuum, forcing the drainage to empty into the bulb.  Emptying the Jackson-Pratt bulb- To empty the bulb: 1. Release the plug on the top of the bulb. 2. Pour the bulb's contents into a measuring container which your nurse will provide. 3. Record the time emptied and amount of drainage. Empty the drain(s) as often as your     doctor or nurse recommends.  Date                  Time                    Amount (Drain 1)  Amount (Drain  2)  _____________________________________________________________________  _____________________________________________________________________  _____________________________________________________________________  _____________________________________________________________________  _____________________________________________________________________  _____________________________________________________________________  _____________________________________________________________________  _____________________________________________________________________  Squeezing the Jackson-Pratt Bulb- To squeeze the bulb: 1. Make sure the plug at the top of the bulb is open. 2. Squeeze the bulb tightly in your fist. You will hear air squeezing from the bulb. 3. Replace the plug while the bulb is squeezed. 4. Use a safety pin to attach the bulb to your clothing. This will keep the catheter from     pulling at the bulb insertion site.  When to call your doctor- Call your doctor if: Drain site becomes red, swollen or hot. You have a fever greater than 101 degrees F. There is oozing at the drain site. Drain falls out (apply a guaze bandage over the drain hole and secure it with tape). Drainage increases daily not related to activity patterns. (You will usually have more drainage when you are active than when you are resting.) Drainage has a bad odor.  No tylenol until after 1:30pm today if needed.

## 2022-09-21 NOTE — Interval H&P Note (Signed)
History and Physical Interval Note: Pt met in the pre op area, no change in exam or indication for surgery. She asked that I also remove a skin tag from her axilla, consent is updated. She was marked with her assistance for a breast reduction. Will proceed at her request  09/21/2022 7:10 AM  Desiree Stewart  has presented today for surgery, with the diagnosis of macromastia.  The various methods of treatment have been discussed with the patient and family. After consideration of risks, benefits and other options for treatment, the patient has consented to  Procedure(s): BREAST REDUCTION WITH LIPOSUCTION (Bilateral) as a surgical intervention.  The patient's history has been reviewed, patient examined, no change in status, stable for surgery.  I have reviewed the patient's chart and labs.  Questions were answered to the patient's satisfaction.     Camillia Herter

## 2022-09-21 NOTE — Transfer of Care (Signed)
Immediate Anesthesia Transfer of Care Note  Patient: Desiree Stewart  Procedure(s) Performed: MAMMARY REDUCTION  (BREAST) (Bilateral: Breast) EXCISION OF SKIN TAG (Right: Axilla)  Patient Location: PACU  Anesthesia Type:General  Level of Consciousness: awake, alert , oriented, drowsy, and patient cooperative  Airway & Oxygen Therapy: Patient Spontanous Breathing  Post-op Assessment: Report given to RN and Post -op Vital signs reviewed and stable  Post vital signs: Reviewed and stable  Last Vitals:  Vitals Value Taken Time  BP    Temp    Pulse 90 09/21/22 1157  Resp 17 09/21/22 1157  SpO2 99 % 09/21/22 1157  Vitals shown include unvalidated device data.  Last Pain:  Vitals:   09/21/22 0633  TempSrc: Oral  PainSc: 0-No pain         Complications: No notable events documented.

## 2022-09-21 NOTE — Anesthesia Preprocedure Evaluation (Addendum)
Anesthesia Evaluation  Patient identified by MRN, date of birth, ID band Patient awake    Reviewed: Allergy & Precautions, NPO status , Patient's Chart, lab work & pertinent test results  Airway Mallampati: II  TM Distance: >3 FB Neck ROM: Full    Dental no notable dental hx.    Pulmonary neg pulmonary ROS   Pulmonary exam normal        Cardiovascular hypertension, Pt. on medications and Pt. on home beta blockers  Rhythm:Regular Rate:Normal     Neuro/Psych negative neurological ROS  negative psych ROS   GI/Hepatic negative GI ROS, Neg liver ROS,,,  Endo/Other  negative endocrine ROS    Renal/GU negative Renal ROS  negative genitourinary   Musculoskeletal Macromastia    Abdominal Normal abdominal exam  (+)   Peds  Hematology negative hematology ROS (+)   Anesthesia Other Findings   Reproductive/Obstetrics                             Anesthesia Physical Anesthesia Plan  ASA: 2  Anesthesia Plan: General   Post-op Pain Management: Celebrex PO (pre-op)* and Tylenol PO (pre-op)*   Induction: Intravenous  PONV Risk Score and Plan: 3 and Ondansetron, Dexamethasone, Midazolam and Treatment may vary due to age or medical condition  Airway Management Planned: Mask and Oral ETT  Additional Equipment: None  Intra-op Plan:   Post-operative Plan: Extubation in OR  Informed Consent: I have reviewed the patients History and Physical, chart, labs and discussed the procedure including the risks, benefits and alternatives for the proposed anesthesia with the patient or authorized representative who has indicated his/her understanding and acceptance.     Dental advisory given  Plan Discussed with: CRNA  Anesthesia Plan Comments:        Anesthesia Quick Evaluation

## 2022-09-21 NOTE — Anesthesia Procedure Notes (Signed)
Procedure Name: Intubation Date/Time: 09/21/2022 7:39 AM  Performed by: Willa Frater, CRNAPre-anesthesia Checklist: Patient identified, Emergency Drugs available, Suction available and Patient being monitored Patient Re-evaluated:Patient Re-evaluated prior to induction Oxygen Delivery Method: Circle system utilized Preoxygenation: Pre-oxygenation with 100% oxygen Induction Type: IV induction Ventilation: Mask ventilation without difficulty Laryngoscope Size: Mac and 3 Grade View: Grade I Tube type: Oral Tube size: 7.0 mm Number of attempts: 1 Airway Equipment and Method: Stylet and Oral airway Placement Confirmation: ETT inserted through vocal cords under direct vision, positive ETCO2 and breath sounds checked- equal and bilateral Secured at: 22 cm Tube secured with: Tape Dental Injury: Teeth and Oropharynx as per pre-operative assessment

## 2022-09-22 ENCOUNTER — Encounter (HOSPITAL_BASED_OUTPATIENT_CLINIC_OR_DEPARTMENT_OTHER): Payer: Self-pay | Admitting: Plastic Surgery

## 2022-09-22 ENCOUNTER — Ambulatory Visit (INDEPENDENT_AMBULATORY_CARE_PROVIDER_SITE_OTHER): Payer: BC Managed Care – PPO | Admitting: Physician Assistant

## 2022-09-22 DIAGNOSIS — Z9889 Other specified postprocedural states: Secondary | ICD-10-CM

## 2022-09-22 LAB — SURGICAL PATHOLOGY

## 2022-09-22 NOTE — Progress Notes (Signed)
Patient is a pleasant 37 year old female with PMH of macromastia s/p bilateral breast reduction performed 09/21/2022 by Dr. Lovena Le who presents to clinic for postoperative follow-up and drain removal.  Today, she is doing well.  Accompanied by her husband at bedside.  She states that her pain has been relatively well-controlled and she has only had to take a single oxycodone since time of surgery.  Drain output has also been minimal, approximately 15 cc from the right and 30 cc from the left today.  She denies any unilateral swelling or and states that the postoperative soreness has been relatively equal bilaterally.  She is eating and drinking, voiding.  No BM yet, but ambulatory and drinking plenty of water.  Physical exam is entirely reassuring.  NAC's appear viable bilaterally and are sensitive to light touch.  Good color.  Breasts with excellent shape and symmetry.  Drains are intact and functional, normal-appearing output in bulbs bilaterally.    Drains removed without complication or difficulty.  She did unfortunately sustained an accidental injury to left breast just lateral of NAC at time of surgery which also appears to be closed and healing appropriately.  Dr. Lovena Le personally evaluated patient today, as well, and agrees with assessment and plan.  Will obtain postoperative photos at subsequent encounter.  She understands to call the clinic should she have any questions or concerns in the interim.  Continued activity modifications and compressive garments.

## 2022-09-30 ENCOUNTER — Encounter: Payer: Self-pay | Admitting: Plastic Surgery

## 2022-09-30 ENCOUNTER — Ambulatory Visit (INDEPENDENT_AMBULATORY_CARE_PROVIDER_SITE_OTHER): Payer: BC Managed Care – PPO | Admitting: Plastic Surgery

## 2022-09-30 VITALS — BP 149/99 | HR 90

## 2022-09-30 DIAGNOSIS — N62 Hypertrophy of breast: Secondary | ICD-10-CM

## 2022-09-30 DIAGNOSIS — Z9889 Other specified postprocedural states: Secondary | ICD-10-CM

## 2022-09-30 NOTE — Progress Notes (Signed)
Desiree Stewart returns today 9 days postoperatively from a bilateral breast reduction.  She is doing well with no specific complaints.  She does note a fullness in her right axilla that she finds bothersome.  On examination she has excellent shape and size and has no evidence of nipple compromise.  The area where the skin was cut on the left breast is healing nicely.  She does have fullness as she notes in the right breast however as I explained to her is difficult to take her breast as large as hers and reduce it without having some excess tissue.  Status post breast reduction: Doing well vascular just to be patient regarding the right axillary fullness as I believe this will significantly improve over the next 2 to 3 months.  She is to start scar massage and slowly increasing her activity as tolerated.  She will return to see me in 3 to 4 weeks.

## 2022-10-12 NOTE — Progress Notes (Unsigned)
Patient is a pleasant 37 year old female with PMH of macromastia s/p bilateral breast reduction performed 09/21/2022 by Dr. Lovena Le who presents to clinic for postoperative follow-up.  She was last seen here in office on 10/29/2022.  At that time, she did express concern regarding excess axillary tissue on the right side, but her exam was entirely reassuring.  NAC's are viable.  Plan for scar massage and follow-up in 3 to 4 weeks.  Today,

## 2022-10-13 ENCOUNTER — Ambulatory Visit (INDEPENDENT_AMBULATORY_CARE_PROVIDER_SITE_OTHER): Payer: BC Managed Care – PPO | Admitting: Physician Assistant

## 2022-10-13 DIAGNOSIS — Z9889 Other specified postprocedural states: Secondary | ICD-10-CM

## 2022-10-13 NOTE — Addendum Note (Signed)
Addended by: Tresa Moore on: 10/13/2022 09:08 AM   Modules accepted: Orders

## 2022-10-13 NOTE — Progress Notes (Signed)
Patient is a pleasant 37 year old female with PMH of macromastia s/p bilateral breast reduction performed 09/21/2022 by Dr. Lovena Le who presents to clinic for postoperative follow-up.  She was last seen here in office on 10/29/2022.  At that time, she did express concern regarding excess axillary tissue on the right side, but her exam was entirely reassuring.  NAC's are viable.  Plan for scar massage and follow-up in 3 to 4 weeks.  Today, patient states that she is doing well.  She does report that she feels as though her right arm cannot lay flat against her side due to excess lateral breast tissue.  She states that she is having to hold her out awkwardly which is leading to a bit of right-sided trapezial neck discomfort.  She understands that it will take time before it settles forward and is trying to relax her arm and avoid strain of the neck.  She denies any wounds or other issues.  She inquires about side sleeping and holding her niece.  She is planning a cruise in May.  She does feel as though some of the upper back discomfort that she had experienced preop from macromastia has improved.  On exam, breasts have excellent shape and symmetry.  She does have right-sided lateral breast fullness, but suspect that it will settle forward and soften over time.  Do not feel as though it would be an issue long-term.  Breasts are relatively soft throughout, but still mildly swollen.  Do not appreciate any subcutaneous fluid collections.  NAC's are healthy, viable.  Incisions all CDI, some residual Dermabond overlying the incisions.  Recommending continued Vaseline over all the incisions to help the Dermabond come off.  Gentle massage of the breasts and incisions.  She can lie on her sides starting next week and can hold her niece given that she only weighs 10 to 15 pounds.  She understands lifting restrictions until she is 6 weeks postop.  Return in 2 weeks for likely final postoperative encounter.  Will then have  her follow-up in a few months with Dr. Lovena Le to see if she has had improvement with lateral breast tissue.  Picture(s) obtained of the patient and placed in the chart were with the patient's or guardian's permission.

## 2022-10-18 DIAGNOSIS — F411 Generalized anxiety disorder: Secondary | ICD-10-CM | POA: Diagnosis not present

## 2022-10-18 DIAGNOSIS — Z1330 Encounter for screening examination for mental health and behavioral disorders, unspecified: Secondary | ICD-10-CM | POA: Diagnosis not present

## 2022-10-18 DIAGNOSIS — R41840 Attention and concentration deficit: Secondary | ICD-10-CM | POA: Diagnosis not present

## 2022-10-18 DIAGNOSIS — F321 Major depressive disorder, single episode, moderate: Secondary | ICD-10-CM | POA: Diagnosis not present

## 2022-10-22 DIAGNOSIS — F902 Attention-deficit hyperactivity disorder, combined type: Secondary | ICD-10-CM | POA: Diagnosis not present

## 2022-10-25 NOTE — Progress Notes (Signed)
Patient is a pleasant 37 year old female with PMH of macromastia s/p bilateral breast reduction performed 09/21/2022 by Dr. Lovena Le who presents to clinic for postoperative follow-up.   She was last seen here in office on 10/13/2022.  At that time, she did report some excess lateral breast tissue on the right which she feels is preventing her from laying her right arm down flat against her side.  However, she felt otherwise improved and her exam was entirely reassuring.  Recommended continued Vaseline to help with the residual Dermabond as well as gentle massage of the breasts and incisions bilaterally.  Plan was for her to return in 2 weeks for likely final postoperative encounter and then again in a few months to discuss with Dr. Lovena Le any residual concerns about excess tissue in right axilla.  Today, patient is doing well from postoperative standpoint and denies any wounds.  However, she continues to endorse some bilateral trapezial discomfort from having to keep arms lifted off of her lateral breast tissue on each side, right slightly more pronounced than the left.  She states that her previous shoulder blade region discomfort from macromastia has resolved with the breast reduction, but she now has this trapezial region pain from feeling as though she needs to keep her arms lifted at all times.  She is hoping that this settles forward, but has not seen any significant change since last encounter.  She has been applying Vaseline to the incisions and had not yet started scar creams.  On exam, breasts have excellent shape and symmetry.  NAC is healthy and viable.  Incisions all well-healed.  Scars are minimal.  She does have slight excess lateral tissue on each side.  Breasts are soft throughout.  Recommending silicone scar gel twice daily x 3 months to all of her incisions.  At this point, cleared of all activity restrictions and compressive garments.  Follow-up in 3 months to discuss concerns regarding  lateral breast tissue with Dr. Lovena Le.  She will call clinic should she have any questions or concerns in interim.  Picture(s) obtained of the patient and placed in the chart were with the patient's or guardian's permission.

## 2022-10-27 ENCOUNTER — Ambulatory Visit (INDEPENDENT_AMBULATORY_CARE_PROVIDER_SITE_OTHER): Payer: BC Managed Care – PPO | Admitting: Physician Assistant

## 2022-10-27 ENCOUNTER — Encounter: Payer: Self-pay | Admitting: Physician Assistant

## 2022-10-27 VITALS — BP 157/100 | HR 70

## 2022-10-27 DIAGNOSIS — Z9889 Other specified postprocedural states: Secondary | ICD-10-CM

## 2022-10-29 DIAGNOSIS — F902 Attention-deficit hyperactivity disorder, combined type: Secondary | ICD-10-CM | POA: Diagnosis not present

## 2022-10-30 DIAGNOSIS — F902 Attention-deficit hyperactivity disorder, combined type: Secondary | ICD-10-CM | POA: Diagnosis not present

## 2022-11-05 DIAGNOSIS — F902 Attention-deficit hyperactivity disorder, combined type: Secondary | ICD-10-CM | POA: Diagnosis not present

## 2022-11-19 DIAGNOSIS — F902 Attention-deficit hyperactivity disorder, combined type: Secondary | ICD-10-CM | POA: Diagnosis not present

## 2022-11-22 DIAGNOSIS — D508 Other iron deficiency anemias: Secondary | ICD-10-CM | POA: Diagnosis not present

## 2022-11-22 DIAGNOSIS — E559 Vitamin D deficiency, unspecified: Secondary | ICD-10-CM | POA: Diagnosis not present

## 2022-11-24 NOTE — Telephone Encounter (Signed)
Please see message. Refer to PT?

## 2022-11-24 NOTE — Telephone Encounter (Signed)
Yes and also back to her PCP for continued work up of neck pain.

## 2022-11-25 ENCOUNTER — Telehealth: Payer: Self-pay | Admitting: Plastic Surgery

## 2022-11-25 DIAGNOSIS — F321 Major depressive disorder, single episode, moderate: Secondary | ICD-10-CM | POA: Diagnosis not present

## 2022-11-25 DIAGNOSIS — H539 Unspecified visual disturbance: Secondary | ICD-10-CM | POA: Diagnosis not present

## 2022-11-25 DIAGNOSIS — I10 Essential (primary) hypertension: Secondary | ICD-10-CM | POA: Diagnosis not present

## 2022-11-25 DIAGNOSIS — Z1330 Encounter for screening examination for mental health and behavioral disorders, unspecified: Secondary | ICD-10-CM | POA: Diagnosis not present

## 2022-11-25 DIAGNOSIS — F411 Generalized anxiety disorder: Secondary | ICD-10-CM | POA: Diagnosis not present

## 2022-11-25 NOTE — Telephone Encounter (Signed)
Returned [employer] and for him to call her back.

## 2022-11-26 ENCOUNTER — Telehealth (INDEPENDENT_AMBULATORY_CARE_PROVIDER_SITE_OTHER): Payer: BC Managed Care – PPO | Admitting: Physician Assistant

## 2022-11-26 DIAGNOSIS — Z9889 Other specified postprocedural states: Secondary | ICD-10-CM

## 2022-11-26 NOTE — Telephone Encounter (Signed)
Just spoke with patient, thank you.

## 2022-11-26 NOTE — Telephone Encounter (Signed)
Patient is a pleasant 37 year old female with history of macromastia now s/p bilateral breast reduction performed 09/21/2022 by Dr. Ladona Ridgel who called the office with complaints of ongoing bilateral trapezial discomfort.  She was seen most recently in the office on 10/27/2022.  At that time, stated that the excess lateral breast tissue on each side, particular the right, prevented her from laying her arm flat against her sides.  She states that this was contributing to some trapezial region discomfort.  Her exam from postoperative standpoint was entirely benign and she had been healing quite nicely.  Plan was for her to follow-up in 3 months to discuss any ongoing concerns related to her lateral breast tissue.  Today, she tells me that this discomfort is still ongoing and she is wanting to know if anything can be done.  This is not a common issue that we see postoperatively, so will refer to physical therapy to see if there is anything that can be done on their end.  Perhaps it is partially attributable to deconditioning from surgery.  She has an appointment already scheduled with Dr. Ladona Ridgel in approximately 8 weeks.  If she has not seen any improvement at that time, can discuss surgical options if any.  Patient was agreeable to plan.  Asked that she follow-up in the interim if she has any additional questions or concerns.

## 2022-11-30 NOTE — Telephone Encounter (Signed)
Thank you, I appreciate it.

## 2022-11-30 NOTE — Telephone Encounter (Signed)
Hi Alesha, I had a telephone encounter with this patient on 11/26/22 and placed an order for physical therapy.  Patient is hoping that she can have it sent to the [facility] on [address]. Can you help facilitate that?

## 2022-12-10 DIAGNOSIS — F902 Attention-deficit hyperactivity disorder, combined type: Secondary | ICD-10-CM | POA: Diagnosis not present

## 2022-12-27 DIAGNOSIS — J3089 Other allergic rhinitis: Secondary | ICD-10-CM | POA: Diagnosis not present

## 2023-01-18 DIAGNOSIS — I10 Essential (primary) hypertension: Secondary | ICD-10-CM | POA: Diagnosis not present

## 2023-01-18 DIAGNOSIS — Z13220 Encounter for screening for lipoid disorders: Secondary | ICD-10-CM | POA: Diagnosis not present

## 2023-01-18 DIAGNOSIS — Z0000 Encounter for general adult medical examination without abnormal findings: Secondary | ICD-10-CM | POA: Diagnosis not present

## 2023-01-18 DIAGNOSIS — Z131 Encounter for screening for diabetes mellitus: Secondary | ICD-10-CM | POA: Diagnosis not present

## 2023-01-18 DIAGNOSIS — Z113 Encounter for screening for infections with a predominantly sexual mode of transmission: Secondary | ICD-10-CM | POA: Diagnosis not present

## 2023-01-27 ENCOUNTER — Ambulatory Visit (INDEPENDENT_AMBULATORY_CARE_PROVIDER_SITE_OTHER): Payer: BC Managed Care – PPO | Admitting: Plastic Surgery

## 2023-01-27 DIAGNOSIS — L905 Scar conditions and fibrosis of skin: Secondary | ICD-10-CM

## 2023-01-27 DIAGNOSIS — N6489 Other specified disorders of breast: Secondary | ICD-10-CM

## 2023-01-27 NOTE — Progress Notes (Signed)
Desiree Stewart returns today for evaluation after a bilateral breast reduction performed September 21, 2022.  In general she is very happy with the overall size and shape of the breast however she does have a fullness along the lateral aspect of the right breast that she is still unhappy with and states prevents her from fully abducting her arm.  On examination there is asymmetry and there is a fullness on the lateral aspect of the breast.  While this is not completely unexpected due to the large size of her breast and the very ptotic nature of her breast I do believe that it would be possible to achieve better symmetry with a scar revision and reduction of the lateral aspect of the breast.  I have discussed this with her and will submit her for surgical consideration.

## 2023-01-28 ENCOUNTER — Encounter: Payer: Self-pay | Admitting: Plastic Surgery

## 2023-03-04 DIAGNOSIS — M542 Cervicalgia: Secondary | ICD-10-CM | POA: Diagnosis not present

## 2023-03-04 DIAGNOSIS — G8929 Other chronic pain: Secondary | ICD-10-CM | POA: Diagnosis not present

## 2023-03-28 DIAGNOSIS — E559 Vitamin D deficiency, unspecified: Secondary | ICD-10-CM | POA: Diagnosis not present

## 2023-03-28 DIAGNOSIS — K429 Umbilical hernia without obstruction or gangrene: Secondary | ICD-10-CM | POA: Diagnosis not present

## 2023-03-28 DIAGNOSIS — Z3009 Encounter for other general counseling and advice on contraception: Secondary | ICD-10-CM | POA: Diagnosis not present

## 2023-03-28 DIAGNOSIS — N644 Mastodynia: Secondary | ICD-10-CM | POA: Diagnosis not present

## 2023-04-07 DIAGNOSIS — E559 Vitamin D deficiency, unspecified: Secondary | ICD-10-CM | POA: Diagnosis not present

## 2023-09-06 DIAGNOSIS — I10 Essential (primary) hypertension: Secondary | ICD-10-CM | POA: Diagnosis not present

## 2023-09-06 DIAGNOSIS — R519 Headache, unspecified: Secondary | ICD-10-CM | POA: Diagnosis not present

## 2023-12-01 DIAGNOSIS — M79675 Pain in left toe(s): Secondary | ICD-10-CM | POA: Diagnosis not present

## 2024-01-20 DIAGNOSIS — Z1330 Encounter for screening examination for mental health and behavioral disorders, unspecified: Secondary | ICD-10-CM | POA: Diagnosis not present

## 2024-01-20 DIAGNOSIS — Z113 Encounter for screening for infections with a predominantly sexual mode of transmission: Secondary | ICD-10-CM | POA: Diagnosis not present

## 2024-01-20 DIAGNOSIS — Z6826 Body mass index (BMI) 26.0-26.9, adult: Secondary | ICD-10-CM | POA: Diagnosis not present

## 2024-01-20 DIAGNOSIS — K5900 Constipation, unspecified: Secondary | ICD-10-CM | POA: Diagnosis not present

## 2024-01-20 DIAGNOSIS — E785 Hyperlipidemia, unspecified: Secondary | ICD-10-CM | POA: Diagnosis not present

## 2024-01-20 DIAGNOSIS — L659 Nonscarring hair loss, unspecified: Secondary | ICD-10-CM | POA: Diagnosis not present

## 2024-01-20 DIAGNOSIS — Z124 Encounter for screening for malignant neoplasm of cervix: Secondary | ICD-10-CM | POA: Diagnosis not present

## 2024-01-20 DIAGNOSIS — I10 Essential (primary) hypertension: Secondary | ICD-10-CM | POA: Diagnosis not present

## 2024-01-20 DIAGNOSIS — Z0000 Encounter for general adult medical examination without abnormal findings: Secondary | ICD-10-CM | POA: Diagnosis not present

## 2024-01-20 DIAGNOSIS — R5383 Other fatigue: Secondary | ICD-10-CM | POA: Diagnosis not present

## 2024-01-20 DIAGNOSIS — R7303 Prediabetes: Secondary | ICD-10-CM | POA: Diagnosis not present

## 2024-01-31 DIAGNOSIS — I10 Essential (primary) hypertension: Secondary | ICD-10-CM | POA: Diagnosis not present

## 2024-01-31 DIAGNOSIS — D509 Iron deficiency anemia, unspecified: Secondary | ICD-10-CM | POA: Diagnosis not present

## 2024-02-01 DIAGNOSIS — K5900 Constipation, unspecified: Secondary | ICD-10-CM | POA: Diagnosis not present

## 2024-02-24 DIAGNOSIS — Z130 Encounter for screening for diseases of the blood and blood-forming organs and certain disorders involving the immune mechanism: Secondary | ICD-10-CM | POA: Diagnosis not present

## 2024-04-02 DIAGNOSIS — K429 Umbilical hernia without obstruction or gangrene: Secondary | ICD-10-CM | POA: Diagnosis not present

## 2024-04-02 DIAGNOSIS — D509 Iron deficiency anemia, unspecified: Secondary | ICD-10-CM | POA: Diagnosis not present

## 2024-04-03 DIAGNOSIS — K5900 Constipation, unspecified: Secondary | ICD-10-CM | POA: Diagnosis not present

## 2024-04-03 DIAGNOSIS — K921 Melena: Secondary | ICD-10-CM | POA: Diagnosis not present

## 2024-04-05 DIAGNOSIS — R5383 Other fatigue: Secondary | ICD-10-CM | POA: Diagnosis not present

## 2024-04-05 DIAGNOSIS — R454 Irritability and anger: Secondary | ICD-10-CM | POA: Diagnosis not present

## 2024-04-05 DIAGNOSIS — I10 Essential (primary) hypertension: Secondary | ICD-10-CM | POA: Diagnosis not present

## 2024-04-05 DIAGNOSIS — N920 Excessive and frequent menstruation with regular cycle: Secondary | ICD-10-CM | POA: Diagnosis not present

## 2024-04-05 DIAGNOSIS — L679 Hair color and hair shaft abnormality, unspecified: Secondary | ICD-10-CM | POA: Diagnosis not present

## 2024-04-06 DIAGNOSIS — R7303 Prediabetes: Secondary | ICD-10-CM | POA: Diagnosis not present

## 2024-04-06 DIAGNOSIS — E785 Hyperlipidemia, unspecified: Secondary | ICD-10-CM | POA: Diagnosis not present

## 2024-05-01 DIAGNOSIS — F900 Attention-deficit hyperactivity disorder, predominantly inattentive type: Secondary | ICD-10-CM | POA: Diagnosis not present

## 2024-05-01 DIAGNOSIS — F411 Generalized anxiety disorder: Secondary | ICD-10-CM | POA: Diagnosis not present

## 2024-05-03 DIAGNOSIS — K648 Other hemorrhoids: Secondary | ICD-10-CM | POA: Diagnosis not present

## 2024-05-03 DIAGNOSIS — K921 Melena: Secondary | ICD-10-CM | POA: Diagnosis not present

## 2024-05-10 DIAGNOSIS — N920 Excessive and frequent menstruation with regular cycle: Secondary | ICD-10-CM | POA: Diagnosis not present

## 2024-05-10 DIAGNOSIS — I10 Essential (primary) hypertension: Secondary | ICD-10-CM | POA: Diagnosis not present

## 2024-05-16 DIAGNOSIS — F411 Generalized anxiety disorder: Secondary | ICD-10-CM | POA: Diagnosis not present

## 2024-05-16 DIAGNOSIS — F900 Attention-deficit hyperactivity disorder, predominantly inattentive type: Secondary | ICD-10-CM | POA: Diagnosis not present

## 2024-05-30 DIAGNOSIS — R109 Unspecified abdominal pain: Secondary | ICD-10-CM | POA: Diagnosis not present

## 2024-05-30 DIAGNOSIS — K5900 Constipation, unspecified: Secondary | ICD-10-CM | POA: Diagnosis not present

## 2024-05-30 DIAGNOSIS — F900 Attention-deficit hyperactivity disorder, predominantly inattentive type: Secondary | ICD-10-CM | POA: Diagnosis not present

## 2024-05-30 DIAGNOSIS — R634 Abnormal weight loss: Secondary | ICD-10-CM | POA: Diagnosis not present

## 2024-06-12 DIAGNOSIS — R634 Abnormal weight loss: Secondary | ICD-10-CM | POA: Diagnosis not present

## 2024-06-19 DIAGNOSIS — G4730 Sleep apnea, unspecified: Secondary | ICD-10-CM | POA: Diagnosis not present

## 2024-06-20 DIAGNOSIS — F900 Attention-deficit hyperactivity disorder, predominantly inattentive type: Secondary | ICD-10-CM | POA: Diagnosis not present

## 2024-07-02 DIAGNOSIS — R350 Frequency of micturition: Secondary | ICD-10-CM | POA: Diagnosis not present

## 2024-07-02 DIAGNOSIS — Z6826 Body mass index (BMI) 26.0-26.9, adult: Secondary | ICD-10-CM | POA: Diagnosis not present

## 2024-07-04 DIAGNOSIS — R109 Unspecified abdominal pain: Secondary | ICD-10-CM | POA: Diagnosis not present
